# Patient Record
Sex: Male | Born: 1945 | Race: White | Hispanic: No | Marital: Married | Smoking: Former smoker
Health system: Southern US, Community
[De-identification: ages and names within clinical notes are randomized; demographics above are authoritative.]

## PROBLEM LIST (undated history)

## (undated) DIAGNOSIS — I6523 Occlusion and stenosis of bilateral carotid arteries: Secondary | ICD-10-CM

## (undated) DIAGNOSIS — K219 Gastro-esophageal reflux disease without esophagitis: Secondary | ICD-10-CM

## (undated) DIAGNOSIS — E785 Hyperlipidemia, unspecified: Secondary | ICD-10-CM

## (undated) DIAGNOSIS — I480 Paroxysmal atrial fibrillation: Secondary | ICD-10-CM

## (undated) DIAGNOSIS — I4892 Unspecified atrial flutter: Secondary | ICD-10-CM

## (undated) DIAGNOSIS — I499 Cardiac arrhythmia, unspecified: Secondary | ICD-10-CM

## (undated) DIAGNOSIS — E7800 Pure hypercholesterolemia, unspecified: Secondary | ICD-10-CM

## (undated) DIAGNOSIS — I10 Essential (primary) hypertension: Secondary | ICD-10-CM

## (undated) HISTORY — DX: Unspecified atrial flutter: I48.92

## (undated) HISTORY — DX: Essential (primary) hypertension: I10

## (undated) HISTORY — DX: Pure hypercholesterolemia, unspecified: E78.00

## (undated) HISTORY — DX: Occlusion and stenosis of bilateral carotid arteries: I65.23

## (undated) HISTORY — PX: EYE SURGERY: SHX253

## (undated) HISTORY — DX: Paroxysmal atrial fibrillation: I48.0

## (undated) HISTORY — DX: Gastro-esophageal reflux disease without esophagitis: K21.9

## (undated) HISTORY — DX: Hyperlipidemia, unspecified: E78.5

---
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
## 2001-12-29 ENCOUNTER — Emergency Department (HOSPITAL_COMMUNITY): Admission: EM | Admit: 2001-12-29 | Discharge: 2001-12-29 | Payer: Self-pay | Admitting: Emergency Medicine

## 2008-03-09 ENCOUNTER — Emergency Department (HOSPITAL_COMMUNITY): Admission: EM | Admit: 2008-03-09 | Discharge: 2008-03-09 | Payer: Self-pay | Admitting: Emergency Medicine

## 2008-03-09 ENCOUNTER — Ambulatory Visit (HOSPITAL_COMMUNITY): Admission: RE | Admit: 2008-03-09 | Discharge: 2008-03-09 | Payer: Self-pay | Admitting: *Deleted

## 2008-03-09 ENCOUNTER — Encounter (INDEPENDENT_AMBULATORY_CARE_PROVIDER_SITE_OTHER): Payer: Self-pay | Admitting: *Deleted

## 2010-11-19 NOTE — Op Note (Signed)
NAME:  ADRIC, WREDE NO.:  [id number]   MEDICAL RECORD NO.:  [id number]          PATIENT TYPE:  AMB   LOCATION:  ENDO                         FACILITY:  [facility]   PHYSICIAN:  Georgiana Spinner, M.D.    DATE OF BIRTH:  [birth year]   DATE OF PROCEDURE:  03/09/2008  DATE OF DISCHARGE:  03/09/2008                               OPERATIVE REPORT   PROCEDURE:  Colonoscopy.   ANESTHESIA:  Fentanyl 50 mcg, Versed 3 mg.   INDICATIONS:  Colon cancer screening.   PROCEDURE IN DETAIL:  With the patient mildly sedated in the left  lateral decubitus position a rectal exam was performed which was  unremarkable to my exam.  Subsequently the Pentax videoscopic  colonoscope was inserted in the rectum and passed under direct vision to  the cecum, identified by appendiceal orifice and ileocecal valve, both  of which were photographed.  From this point the colonoscope was slowly  withdrawn taking circumferential views of colonic mucosa stopping only  in the ascending colon where a small polyp was seen and it was  photographed and it was removed using hot biopsy forceps technique,  actually we biopsied it with the hot biopsy forceps but did not apply  current to it at that time but then went and eradicated the polyp  remainder with the hot biopsy forceps.  From this point the colonoscope  was then further withdrawn taking circumferential views of colonic  mucosa stopping only in the rectum which appeared normal on direct and  showed internal hemorrhoids on retroflexed view.  The endoscope was  straightened and withdrawn.  The patient's vital signs, pulse oximeter  remained stable.  The patient tolerated the procedure well without  apparent complications.   FINDINGS:  Small polyp of ascending colon.  Internal hemorrhoids.  Otherwise unremarkable exam.   PLAN:  Await biopsy report.  The patient will call me for results and  follow up with me as needed as an outpatient.     ______________________________  Georgiana Spinner, M.D.     GMO/MEDQ  D:  03/09/2008  T:  03/09/2008  Job:  [id number]

## 2010-11-19 NOTE — Op Note (Signed)
NAME:  Douglas Griffith, Douglas Griffith NO.:  [id number]   MEDICAL RECORD NO.:  [id number]          PATIENT TYPE:  EMS   LOCATION:  ED                           FACILITY:  [facility]   PHYSICIAN:  Georgiana Spinner, M.D.    DATE OF BIRTH:  01/18/1946   DATE OF PROCEDURE:  DATE OF DISCHARGE:  03/09/2008                               OPERATIVE REPORT   PROCEDURE:  Upper endoscopy.   INDICATIONS:  GERD.   ANESTHESIA:  Fentanyl 50 mcg, Versed 7 mg.   DESCRIPTION OF PROCEDURE:  With the patient mildly sedated in the left  lateral decubitus position the Pentax videoscopic endoscope was inserted  in the mouth, passed under direct vision through the esophagus, which  appeared normal.  There was no evidence of Barrett's esophagus.  We  entered into the stomach.  The fundus, body, antrum, duodenal bulb, and  second portion of the duodenum appeared normal.  From this point the  endoscope was slowly withdrawn, taking circumferential views of the  duodenal mucosa until the endoscope been pulled back into the stomach  and placed in retroflexion to view the stomach from below.  The  endoscope was straightened and withdrawn, taking circumferential views  of the remaining gastric and esophageal mucosa.  The patient's vital  signs, pulse oximeter remained stable.  The patient tolerated the  procedure well without apparent complications.   FINDINGS:  Negative exam.   PLAN:  Proceed to colonoscopy.           ______________________________  Georgiana Spinner, M.D.     GMO/MEDQ  D:  03/09/2008  T:  03/09/2008  Job:  [id number]

## 2011-04-09 LAB — BASIC METABOLIC PANEL
CO2: 27
Calcium: 9.4
Creatinine, Ser: 0.94
Glucose, Bld: 117 — ABNORMAL HIGH

## 2011-04-09 LAB — POCT I-STAT, CHEM 8
Calcium, Ion: 1.21
Chloride: 108
Creatinine, Ser: 1
Glucose, Bld: 116 — ABNORMAL HIGH
HCT: 47

## 2011-06-06 ENCOUNTER — Encounter: Payer: Self-pay | Admitting: Gastroenterology

## 2011-08-28 ENCOUNTER — Encounter: Payer: Self-pay | Admitting: Internal Medicine

## 2011-10-04 ENCOUNTER — Encounter: Payer: Self-pay | Admitting: Internal Medicine

## 2012-08-05 ENCOUNTER — Ambulatory Visit (INDEPENDENT_AMBULATORY_CARE_PROVIDER_SITE_OTHER): Payer: 59 | Admitting: Internal Medicine

## 2012-08-05 ENCOUNTER — Encounter: Payer: Self-pay | Admitting: Internal Medicine

## 2012-08-05 VITALS — BP 132/84 | HR 61 | Ht 72.0 in | Wt 201.1 lb

## 2012-08-05 DIAGNOSIS — I4891 Unspecified atrial fibrillation: Secondary | ICD-10-CM

## 2012-08-05 NOTE — Progress Notes (Signed)
Primary Care Physician: Pearson Grippe, MD Referring Physician:  Dr Festus Aloe is a 67 y.o. male with a h/o paroxysmal atrial fibrillation who presents today for EP consultation.  He reports that he was initially diagnosed with atrial fibrillation 2-3 years ago after presenting for a colonoscopy and being found to have atrial fibrillation.  He was asymptomatic at the time.  He had a monitor placed which revealed episode afib with RVR.   He was placed on cardizem and flecainide.  He had a repeat cardiac monitor placed 12/13 which revealed significant decrease in afib burden and adequate rate control.  He has rare nocturnal palpitations.  His exercise tolerance and energy are preserved.   Today, he denies symptoms of chest pain, shortness of breath, orthopnea, PND, lower extremity edema, dizziness, presyncope, syncope, or neurologic sequela. The patient is tolerating medications without difficulties and is otherwise without complaint today.   Past Medical History  Diagnosis Date  . Paroxysmal atrial fibrillation   . Atrial flutter   . Hyperlipidemia   . GERD (gastroesophageal reflux disease)    Past Surgical History  Procedure Date  . Appendectomy 1960    Current Outpatient Prescriptions  Medication Sig Dispense Refill  . aspirin 81 MG tablet Take 81 mg by mouth daily.      Marland Kitchen diltiazem (CARDIZEM CD) 180 MG 24 hr capsule Take 180 mg by mouth daily.      Marland Kitchen ezetimibe-simvastatin (VYTORIN) 10-40 MG per tablet Take 1 tablet by mouth at bedtime.      . famotidine (PEPCID AC) 10 MG chewable tablet Chew 10 mg by mouth daily.      . flecainide (TAMBOCOR) 100 MG tablet Take 100 mg by mouth 2 (two) times daily.      . shark liver oil-cocoa butter (PREPARATION H) 0.25-3-85.5 % suppository Place 1 suppository rectally as needed.        No Known Allergies  History   Social History  . Marital Status: Married    Spouse Name: N/A    Number of Children: N/A  . Years of Education: N/A    Occupational History  . Not on file.   Social History Main Topics  . Smoking status: Former Games developer  . Smokeless tobacco: Not on file  . Alcohol Use: Yes     Comment: 2-3 glasses of wine per day  . Drug Use: No  . Sexually Active: Not on file   Other Topics Concern  . Not on file   Social History Narrative   Lives in [place].  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    Family History  Problem Relation Age of Onset  . CAD Father     died of MI at age 35  . CAD Brother     ROS- All systems are reviewed and negative except as per the HPI above  Physical Exam: Filed Vitals:   08/05/12 1225  BP: 156/77  Pulse: 61  Height: 6' (1.829 m)  Weight: 201 lb 1.6 oz (91.218 kg)    GEN- The patient is well appearing, alert and oriented x 3 today.   Head- normocephalic, atraumatic Eyes-  Sclera clear, conjunctiva pink Ears- hearing intact Oropharynx- clear Neck- supple, no JVP Lymph- no cervical lymphadenopathy Lungs- Clear to ausculation bilaterally, normal work of breathing Heart- Regular rate and rhythm, no murmurs, rubs or gallops, PMI not laterally displaced GI- soft, NT, ND, + BS Extremities- no  clubbing, cyanosis, or edema MS- no significant deformity or atrophy Skin- no rash or lesion Psych- euthymic mood, full affect Neuro- strength and sensation are intact  EKG from 07/23/12 reveals sinus rhythm 59 bpm, nonspecific St/ T changes Event monitor is also reviewed  Assessment and Plan:

## 2012-08-05 NOTE — Patient Instructions (Addendum)
Your physician recommends that you schedule a follow-up appointment as needed  

## 2012-08-16 ENCOUNTER — Encounter: Payer: Self-pay | Admitting: Internal Medicine

## 2012-08-16 DIAGNOSIS — I480 Paroxysmal atrial fibrillation: Secondary | ICD-10-CM | POA: Insufficient documentation

## 2012-08-16 NOTE — Assessment & Plan Note (Signed)
The patient has symptomatic paroxysmal atrial fibrillation.  Therapeutic strategies for afib including medicine and ablation were discussed in detail with the patient today. Risk, benefits, and alternatives to EP study and radiofrequency ablation for afib were also discussed in detail today.  At this time, he feels that his afib is reasonably well controlled with flecainide and diltiazem.  He would like to continue this regimen going forward.  He is not interested in ablation at this time.  His CHADS2 score is 0.  He will continue ASA. I will therefore return his care to Dr Jacinto Halim and am happy to see him going forward should he have additional afib and decide to reconsider ablation.

## 2012-08-19 ENCOUNTER — Institutional Professional Consult (permissible substitution): Payer: Self-pay | Admitting: Internal Medicine

## 2015-12-13 DIAGNOSIS — I6523 Occlusion and stenosis of bilateral carotid arteries: Secondary | ICD-10-CM | POA: Diagnosis not present

## 2015-12-13 DIAGNOSIS — E7800 Pure hypercholesterolemia, unspecified: Secondary | ICD-10-CM | POA: Diagnosis not present

## 2015-12-13 DIAGNOSIS — R739 Hyperglycemia, unspecified: Secondary | ICD-10-CM | POA: Diagnosis not present

## 2015-12-20 DIAGNOSIS — E559 Vitamin D deficiency, unspecified: Secondary | ICD-10-CM | POA: Diagnosis not present

## 2015-12-20 DIAGNOSIS — E7800 Pure hypercholesterolemia, unspecified: Secondary | ICD-10-CM | POA: Diagnosis not present

## 2015-12-20 DIAGNOSIS — R739 Hyperglycemia, unspecified: Secondary | ICD-10-CM | POA: Diagnosis not present

## 2015-12-20 DIAGNOSIS — I480 Paroxysmal atrial fibrillation: Secondary | ICD-10-CM | POA: Diagnosis not present

## 2015-12-20 DIAGNOSIS — Z0000 Encounter for general adult medical examination without abnormal findings: Secondary | ICD-10-CM | POA: Diagnosis not present

## 2016-06-18 DIAGNOSIS — I480 Paroxysmal atrial fibrillation: Secondary | ICD-10-CM | POA: Diagnosis not present

## 2016-06-18 DIAGNOSIS — Z125 Encounter for screening for malignant neoplasm of prostate: Secondary | ICD-10-CM | POA: Diagnosis not present

## 2016-06-18 DIAGNOSIS — R739 Hyperglycemia, unspecified: Secondary | ICD-10-CM | POA: Diagnosis not present

## 2016-06-18 DIAGNOSIS — E559 Vitamin D deficiency, unspecified: Secondary | ICD-10-CM | POA: Diagnosis not present

## 2016-06-18 DIAGNOSIS — Z0000 Encounter for general adult medical examination without abnormal findings: Secondary | ICD-10-CM | POA: Diagnosis not present

## 2016-06-18 DIAGNOSIS — E7800 Pure hypercholesterolemia, unspecified: Secondary | ICD-10-CM | POA: Diagnosis not present

## 2016-06-24 DIAGNOSIS — I6523 Occlusion and stenosis of bilateral carotid arteries: Secondary | ICD-10-CM | POA: Diagnosis not present

## 2016-06-26 DIAGNOSIS — I10 Essential (primary) hypertension: Secondary | ICD-10-CM | POA: Diagnosis not present

## 2016-06-26 DIAGNOSIS — I6523 Occlusion and stenosis of bilateral carotid arteries: Secondary | ICD-10-CM | POA: Diagnosis not present

## 2016-06-26 DIAGNOSIS — I480 Paroxysmal atrial fibrillation: Secondary | ICD-10-CM | POA: Diagnosis not present

## 2016-06-26 DIAGNOSIS — E7800 Pure hypercholesterolemia, unspecified: Secondary | ICD-10-CM | POA: Diagnosis not present

## 2016-08-14 DIAGNOSIS — Z125 Encounter for screening for malignant neoplasm of prostate: Secondary | ICD-10-CM | POA: Diagnosis not present

## 2016-08-14 DIAGNOSIS — E559 Vitamin D deficiency, unspecified: Secondary | ICD-10-CM | POA: Diagnosis not present

## 2016-08-21 DIAGNOSIS — E559 Vitamin D deficiency, unspecified: Secondary | ICD-10-CM | POA: Diagnosis not present

## 2016-08-21 DIAGNOSIS — R739 Hyperglycemia, unspecified: Secondary | ICD-10-CM | POA: Diagnosis not present

## 2016-08-21 DIAGNOSIS — Z23 Encounter for immunization: Secondary | ICD-10-CM | POA: Diagnosis not present

## 2016-08-21 DIAGNOSIS — I480 Paroxysmal atrial fibrillation: Secondary | ICD-10-CM | POA: Diagnosis not present

## 2016-08-21 DIAGNOSIS — Z0000 Encounter for general adult medical examination without abnormal findings: Secondary | ICD-10-CM | POA: Diagnosis not present

## 2016-11-25 DIAGNOSIS — H33193 Other retinoschisis and retinal cysts, bilateral: Secondary | ICD-10-CM | POA: Diagnosis not present

## 2016-12-16 DIAGNOSIS — I6523 Occlusion and stenosis of bilateral carotid arteries: Secondary | ICD-10-CM | POA: Diagnosis not present

## 2016-12-16 DIAGNOSIS — R0989 Other specified symptoms and signs involving the circulatory and respiratory systems: Secondary | ICD-10-CM | POA: Diagnosis not present

## 2016-12-22 DIAGNOSIS — H33199 Other retinoschisis and retinal cysts, unspecified eye: Secondary | ICD-10-CM | POA: Diagnosis not present

## 2016-12-22 DIAGNOSIS — H5213 Myopia, bilateral: Secondary | ICD-10-CM | POA: Diagnosis not present

## 2016-12-22 DIAGNOSIS — H2513 Age-related nuclear cataract, bilateral: Secondary | ICD-10-CM | POA: Diagnosis not present

## 2017-01-01 DIAGNOSIS — H52202 Unspecified astigmatism, left eye: Secondary | ICD-10-CM | POA: Diagnosis not present

## 2017-01-01 DIAGNOSIS — K219 Gastro-esophageal reflux disease without esophagitis: Secondary | ICD-10-CM | POA: Diagnosis not present

## 2017-01-01 DIAGNOSIS — Z7901 Long term (current) use of anticoagulants: Secondary | ICD-10-CM | POA: Diagnosis not present

## 2017-01-01 DIAGNOSIS — H2512 Age-related nuclear cataract, left eye: Secondary | ICD-10-CM | POA: Diagnosis not present

## 2017-01-01 DIAGNOSIS — I4891 Unspecified atrial fibrillation: Secondary | ICD-10-CM | POA: Diagnosis not present

## 2017-01-02 DIAGNOSIS — H2511 Age-related nuclear cataract, right eye: Secondary | ICD-10-CM | POA: Diagnosis not present

## 2017-01-08 DIAGNOSIS — H16143 Punctate keratitis, bilateral: Secondary | ICD-10-CM | POA: Diagnosis not present

## 2017-01-13 DIAGNOSIS — H52201 Unspecified astigmatism, right eye: Secondary | ICD-10-CM | POA: Diagnosis not present

## 2017-01-13 DIAGNOSIS — Z7901 Long term (current) use of anticoagulants: Secondary | ICD-10-CM | POA: Diagnosis not present

## 2017-01-13 DIAGNOSIS — K219 Gastro-esophageal reflux disease without esophagitis: Secondary | ICD-10-CM | POA: Diagnosis not present

## 2017-01-13 DIAGNOSIS — I4891 Unspecified atrial fibrillation: Secondary | ICD-10-CM | POA: Diagnosis not present

## 2017-01-13 DIAGNOSIS — H2511 Age-related nuclear cataract, right eye: Secondary | ICD-10-CM | POA: Diagnosis not present

## 2017-01-20 DIAGNOSIS — H16143 Punctate keratitis, bilateral: Secondary | ICD-10-CM | POA: Diagnosis not present

## 2017-02-12 DIAGNOSIS — I480 Paroxysmal atrial fibrillation: Secondary | ICD-10-CM | POA: Diagnosis not present

## 2017-02-12 DIAGNOSIS — E7800 Pure hypercholesterolemia, unspecified: Secondary | ICD-10-CM | POA: Diagnosis not present

## 2017-02-12 DIAGNOSIS — R739 Hyperglycemia, unspecified: Secondary | ICD-10-CM | POA: Diagnosis not present

## 2017-02-12 DIAGNOSIS — D649 Anemia, unspecified: Secondary | ICD-10-CM | POA: Diagnosis not present

## 2017-02-12 DIAGNOSIS — E559 Vitamin D deficiency, unspecified: Secondary | ICD-10-CM | POA: Diagnosis not present

## 2017-02-24 DIAGNOSIS — H16223 Keratoconjunctivitis sicca, not specified as Sjogren's, bilateral: Secondary | ICD-10-CM | POA: Diagnosis not present

## 2017-03-02 DIAGNOSIS — E559 Vitamin D deficiency, unspecified: Secondary | ICD-10-CM | POA: Diagnosis not present

## 2017-03-02 DIAGNOSIS — I480 Paroxysmal atrial fibrillation: Secondary | ICD-10-CM | POA: Diagnosis not present

## 2017-03-02 DIAGNOSIS — E7800 Pure hypercholesterolemia, unspecified: Secondary | ICD-10-CM | POA: Diagnosis not present

## 2017-03-02 DIAGNOSIS — R739 Hyperglycemia, unspecified: Secondary | ICD-10-CM | POA: Diagnosis not present

## 2017-04-09 DIAGNOSIS — H16223 Keratoconjunctivitis sicca, not specified as Sjogren's, bilateral: Secondary | ICD-10-CM | POA: Diagnosis not present

## 2017-06-11 DIAGNOSIS — I6523 Occlusion and stenosis of bilateral carotid arteries: Secondary | ICD-10-CM | POA: Diagnosis not present

## 2017-07-01 DIAGNOSIS — I6523 Occlusion and stenosis of bilateral carotid arteries: Secondary | ICD-10-CM | POA: Diagnosis not present

## 2017-07-01 DIAGNOSIS — E7800 Pure hypercholesterolemia, unspecified: Secondary | ICD-10-CM | POA: Diagnosis not present

## 2017-07-01 DIAGNOSIS — I10 Essential (primary) hypertension: Secondary | ICD-10-CM | POA: Diagnosis not present

## 2017-07-01 DIAGNOSIS — I480 Paroxysmal atrial fibrillation: Secondary | ICD-10-CM | POA: Diagnosis not present

## 2017-08-17 DIAGNOSIS — Z0000 Encounter for general adult medical examination without abnormal findings: Secondary | ICD-10-CM | POA: Diagnosis not present

## 2017-08-17 DIAGNOSIS — E559 Vitamin D deficiency, unspecified: Secondary | ICD-10-CM | POA: Diagnosis not present

## 2017-08-17 DIAGNOSIS — E7800 Pure hypercholesterolemia, unspecified: Secondary | ICD-10-CM | POA: Diagnosis not present

## 2017-08-17 DIAGNOSIS — R739 Hyperglycemia, unspecified: Secondary | ICD-10-CM | POA: Diagnosis not present

## 2017-10-28 DIAGNOSIS — E559 Vitamin D deficiency, unspecified: Secondary | ICD-10-CM | POA: Diagnosis not present

## 2017-10-28 DIAGNOSIS — I480 Paroxysmal atrial fibrillation: Secondary | ICD-10-CM | POA: Diagnosis not present

## 2017-10-28 DIAGNOSIS — E7800 Pure hypercholesterolemia, unspecified: Secondary | ICD-10-CM | POA: Diagnosis not present

## 2017-10-28 DIAGNOSIS — Z0000 Encounter for general adult medical examination without abnormal findings: Secondary | ICD-10-CM | POA: Diagnosis not present

## 2017-12-09 DIAGNOSIS — I6523 Occlusion and stenosis of bilateral carotid arteries: Secondary | ICD-10-CM | POA: Diagnosis not present

## 2018-04-27 DIAGNOSIS — M67911 Unspecified disorder of synovium and tendon, right shoulder: Secondary | ICD-10-CM | POA: Diagnosis not present

## 2018-04-27 DIAGNOSIS — M67912 Unspecified disorder of synovium and tendon, left shoulder: Secondary | ICD-10-CM | POA: Diagnosis not present

## 2018-05-17 DIAGNOSIS — M67912 Unspecified disorder of synovium and tendon, left shoulder: Secondary | ICD-10-CM | POA: Diagnosis not present

## 2018-05-17 DIAGNOSIS — I480 Paroxysmal atrial fibrillation: Secondary | ICD-10-CM | POA: Diagnosis not present

## 2018-05-17 DIAGNOSIS — M7501 Adhesive capsulitis of right shoulder: Secondary | ICD-10-CM | POA: Diagnosis not present

## 2018-05-17 DIAGNOSIS — E7800 Pure hypercholesterolemia, unspecified: Secondary | ICD-10-CM | POA: Diagnosis not present

## 2018-05-17 DIAGNOSIS — M67911 Unspecified disorder of synovium and tendon, right shoulder: Secondary | ICD-10-CM | POA: Diagnosis not present

## 2018-05-17 DIAGNOSIS — E559 Vitamin D deficiency, unspecified: Secondary | ICD-10-CM | POA: Diagnosis not present

## 2018-05-17 DIAGNOSIS — M7502 Adhesive capsulitis of left shoulder: Secondary | ICD-10-CM | POA: Diagnosis not present

## 2018-05-17 DIAGNOSIS — R739 Hyperglycemia, unspecified: Secondary | ICD-10-CM | POA: Diagnosis not present

## 2018-05-20 DIAGNOSIS — M67912 Unspecified disorder of synovium and tendon, left shoulder: Secondary | ICD-10-CM | POA: Diagnosis not present

## 2018-05-20 DIAGNOSIS — R739 Hyperglycemia, unspecified: Secondary | ICD-10-CM | POA: Diagnosis not present

## 2018-05-20 DIAGNOSIS — E559 Vitamin D deficiency, unspecified: Secondary | ICD-10-CM | POA: Diagnosis not present

## 2018-05-20 DIAGNOSIS — M67911 Unspecified disorder of synovium and tendon, right shoulder: Secondary | ICD-10-CM | POA: Diagnosis not present

## 2018-05-20 DIAGNOSIS — E7800 Pure hypercholesterolemia, unspecified: Secondary | ICD-10-CM | POA: Diagnosis not present

## 2018-05-20 DIAGNOSIS — M25611 Stiffness of right shoulder, not elsewhere classified: Secondary | ICD-10-CM | POA: Diagnosis not present

## 2018-05-20 DIAGNOSIS — I480 Paroxysmal atrial fibrillation: Secondary | ICD-10-CM | POA: Diagnosis not present

## 2018-05-28 DIAGNOSIS — M25612 Stiffness of left shoulder, not elsewhere classified: Secondary | ICD-10-CM | POA: Diagnosis not present

## 2018-05-28 DIAGNOSIS — M67911 Unspecified disorder of synovium and tendon, right shoulder: Secondary | ICD-10-CM | POA: Diagnosis not present

## 2018-05-28 DIAGNOSIS — M67912 Unspecified disorder of synovium and tendon, left shoulder: Secondary | ICD-10-CM | POA: Diagnosis not present

## 2018-05-28 DIAGNOSIS — M25611 Stiffness of right shoulder, not elsewhere classified: Secondary | ICD-10-CM | POA: Diagnosis not present

## 2018-05-31 DIAGNOSIS — M67912 Unspecified disorder of synovium and tendon, left shoulder: Secondary | ICD-10-CM | POA: Diagnosis not present

## 2018-05-31 DIAGNOSIS — M25611 Stiffness of right shoulder, not elsewhere classified: Secondary | ICD-10-CM | POA: Diagnosis not present

## 2018-05-31 DIAGNOSIS — M25612 Stiffness of left shoulder, not elsewhere classified: Secondary | ICD-10-CM | POA: Diagnosis not present

## 2018-05-31 DIAGNOSIS — M67911 Unspecified disorder of synovium and tendon, right shoulder: Secondary | ICD-10-CM | POA: Diagnosis not present

## 2018-06-15 DIAGNOSIS — I6523 Occlusion and stenosis of bilateral carotid arteries: Secondary | ICD-10-CM | POA: Diagnosis not present

## 2018-06-28 DIAGNOSIS — E7800 Pure hypercholesterolemia, unspecified: Secondary | ICD-10-CM | POA: Diagnosis not present

## 2018-06-28 DIAGNOSIS — I480 Paroxysmal atrial fibrillation: Secondary | ICD-10-CM | POA: Diagnosis not present

## 2018-06-28 DIAGNOSIS — I6522 Occlusion and stenosis of left carotid artery: Secondary | ICD-10-CM | POA: Diagnosis not present

## 2018-06-28 DIAGNOSIS — I10 Essential (primary) hypertension: Secondary | ICD-10-CM | POA: Diagnosis not present

## 2018-08-13 ENCOUNTER — Other Ambulatory Visit: Payer: Self-pay | Admitting: Cardiology

## 2018-09-10 ENCOUNTER — Other Ambulatory Visit: Payer: Self-pay

## 2018-09-10 MED ORDER — RIVAROXABAN 20 MG PO TABS: 20.0000 mg | ORAL_TABLET | Freq: Every day | ORAL | 1 refills | Status: DC

## 2018-09-24 ENCOUNTER — Other Ambulatory Visit: Payer: Self-pay | Admitting: Cardiology

## 2018-09-24 DIAGNOSIS — I10 Essential (primary) hypertension: Secondary | ICD-10-CM

## 2018-10-19 ENCOUNTER — Other Ambulatory Visit: Payer: Self-pay | Admitting: Cardiology

## 2018-10-19 DIAGNOSIS — I6522 Occlusion and stenosis of left carotid artery: Secondary | ICD-10-CM

## 2018-10-29 ENCOUNTER — Other Ambulatory Visit: Payer: Self-pay | Admitting: Cardiology

## 2018-11-01 ENCOUNTER — Other Ambulatory Visit: Payer: Self-pay | Admitting: Cardiology

## 2018-11-01 MED ORDER — DILT-XR 180 MG PO CP24: 180.0000 mg | ORAL_CAPSULE | Freq: Two times a day (BID) | ORAL | 3 refills | Status: DC

## 2018-11-15 DIAGNOSIS — E559 Vitamin D deficiency, unspecified: Secondary | ICD-10-CM | POA: Diagnosis not present

## 2018-11-15 DIAGNOSIS — I10 Essential (primary) hypertension: Secondary | ICD-10-CM | POA: Diagnosis not present

## 2018-11-15 DIAGNOSIS — E7800 Pure hypercholesterolemia, unspecified: Secondary | ICD-10-CM | POA: Diagnosis not present

## 2018-11-15 DIAGNOSIS — Z0000 Encounter for general adult medical examination without abnormal findings: Secondary | ICD-10-CM | POA: Diagnosis not present

## 2018-11-15 DIAGNOSIS — R739 Hyperglycemia, unspecified: Secondary | ICD-10-CM | POA: Diagnosis not present

## 2018-11-18 DIAGNOSIS — E559 Vitamin D deficiency, unspecified: Secondary | ICD-10-CM | POA: Diagnosis not present

## 2018-11-18 DIAGNOSIS — R739 Hyperglycemia, unspecified: Secondary | ICD-10-CM | POA: Diagnosis not present

## 2018-11-18 DIAGNOSIS — I10 Essential (primary) hypertension: Secondary | ICD-10-CM | POA: Diagnosis not present

## 2018-11-18 DIAGNOSIS — E7800 Pure hypercholesterolemia, unspecified: Secondary | ICD-10-CM | POA: Diagnosis not present

## 2018-12-15 ENCOUNTER — Other Ambulatory Visit: Payer: Self-pay

## 2018-12-18 ENCOUNTER — Other Ambulatory Visit: Payer: Self-pay | Admitting: Cardiology

## 2018-12-18 DIAGNOSIS — I10 Essential (primary) hypertension: Secondary | ICD-10-CM

## 2018-12-23 ENCOUNTER — Ambulatory Visit: Payer: Self-pay | Admitting: Cardiology

## 2019-01-06 ENCOUNTER — Other Ambulatory Visit: Payer: Self-pay

## 2019-01-06 ENCOUNTER — Ambulatory Visit: Payer: Medicare Other

## 2019-01-06 DIAGNOSIS — I6522 Occlusion and stenosis of left carotid artery: Secondary | ICD-10-CM

## 2019-01-13 ENCOUNTER — Encounter: Payer: Self-pay | Admitting: Cardiology

## 2019-01-13 ENCOUNTER — Other Ambulatory Visit: Payer: Self-pay

## 2019-01-13 ENCOUNTER — Ambulatory Visit: Payer: Medicare Other | Admitting: Cardiology

## 2019-01-13 VITALS — BP 140/78 | Ht 72.0 in | Wt 194.0 lb

## 2019-01-13 DIAGNOSIS — I480 Paroxysmal atrial fibrillation: Secondary | ICD-10-CM | POA: Diagnosis not present

## 2019-01-13 DIAGNOSIS — I6523 Occlusion and stenosis of bilateral carotid arteries: Secondary | ICD-10-CM | POA: Insufficient documentation

## 2019-01-13 DIAGNOSIS — I10 Essential (primary) hypertension: Secondary | ICD-10-CM | POA: Diagnosis not present

## 2019-01-13 DIAGNOSIS — E7800 Pure hypercholesterolemia, unspecified: Secondary | ICD-10-CM

## 2019-01-13 NOTE — Progress Notes (Signed)
Virtual Visit via Video Note: This visit type was conducted due to national recommendations for restrictions regarding the COVID-19 Pandemic (e.g. social distancing).  This format is felt to be most appropriate for this patient at this time.  All issues noted in this document were discussed and addressed.  No physical exam was performed (except for noted visual exam findings with Telehealth visits).  The patient has consented to conduct a Telehealth visit and understands insurance will be billed.   I connected with@, on 01/13/19 at  by a video enabled telemedicine application and verified that I am speaking with the correct person using two identifiers.   I discussed the limitations of evaluation and management by telemedicine and the availability of in person appointments. The patient expressed understanding and agreed to proceed.   I have discussed with patient regarding the safety during COVID Pandemic and steps and precautions to be taken including social distancing, frequent hand wash and use of detergent soap, gels with the patient. I asked the patient to avoid touching mouth, nose, eyes, ears with the hands. I encouraged regular walking around the neighborhood and exercise and regular diet, as long as social distancing can be maintained.  Primary Physician/Referring:  Jani Gravel, MD  Patient ID: Douglas Griffith, male    DOB: [birth year], 73 y.o.   MRN: [id number]  Chief Complaint  Patient presents with  . Atrial Fibrillation  . Follow-up    HPI: Douglas Griffith  is a 73 y.o. male   Caucasian male with history of hypertension, hyperlipidemia, asymptomatic carotid stenosis and  paroxysmal atrial fibrillation. He presents today for one year follow-up of atrial fibrillation and also carotid stenosis.  Patient has not had any further palpitations in the past 6 months. No chest pain, no dyspnea, no dizziness or syncope. No symptoms to suggest TIA or claudication.  Past Medical History:   Diagnosis Date  . Asymptomatic bilateral carotid artery stenosis   . Atrial flutter ([place])   . Essential hypertension   . GERD (gastroesophageal reflux disease)   . Hypercholesteremia   . Paroxysmal atrial fibrillation [facility])     Past Surgical History:  Procedure Laterality Date  . APPENDECTOMY  1960    Social History   Socioeconomic History  . Marital status: Married    Spouse name: Not on file  . Number of children: 2  . Years of education: Not on file  . Highest education level: Not on file  Occupational History  . Not on file  Social Needs  . Financial resource strain: Not on file  . Food insecurity    Worry: Not on file    Inability: Not on file  . Transportation needs    Medical: Not on file    Non-medical: Not on file  Tobacco Use  . Smoking status: Former Research scientist (life sciences)  . Smokeless tobacco: Never Used  . Tobacco comment: smoked as a teen  Substance and Sexual Activity  . Alcohol use: Yes    Comment: 2-3 glasses of wine per day  . Drug use: No  . Sexual activity: Not on file  Lifestyle  . Physical activity    Days per week: Not on file    Minutes per session: Not on file  . Stress: Not on file  Relationships  . Social Herbalist on phone: Not on file    Gets together: Not on file    Attends religious service: Not on file    Active member of club or  organization: Not on file    Attends meetings of clubs or organizations: Not on file    Relationship status: Not on file  . Intimate partner violence    Fear of current or ex partner: Not on file    Emotionally abused: Not on file    Physically abused: Not on file    Forced sexual activity: Not on file  Other Topics Concern  . Not on file  Social History Narrative   Lives in [place].  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    Review of Systems  Constitution: Negative for chills, decreased appetite, malaise/fatigue and weight  gain.  Cardiovascular: Negative for dyspnea on exertion, leg swelling and syncope.  Endocrine: Negative for cold intolerance.  Hematologic/Lymphatic: Does not bruise/bleed easily.  Musculoskeletal: Negative for joint swelling.  Gastrointestinal: Negative for abdominal pain, anorexia, change in bowel habit, hematochezia and melena.  Neurological: Positive for dizziness (occasional). Negative for headaches and light-headedness.  Psychiatric/Behavioral: Negative for depression and substance abuse.  All other systems reviewed and are negative.  Objective  Blood pressure 140/78, height 6' (1.829 m), weight 194 lb (88 kg). Body mass index is 26.31 kg/m. Physical exam not performed or limited due to virtual visit.  Patient appeared to be in no distress, Neck was supple, respiration was not labored.  Please see exam details from prior visit is as below.    Physical Exam  Constitutional: He appears well-developed and well-nourished. No distress.  HENT:  Head: Atraumatic.  Eyes: Conjunctivae are normal.  Neck: Neck supple. No JVD present. No thyromegaly present.  Cardiovascular: Normal rate, regular rhythm, normal heart sounds, intact distal pulses and normal pulses. Exam reveals no gallop.  No murmur heard. No edema. No JVD. Vascular exam except for right carotid bruit is normal.   Pulmonary/Chest: Effort normal and breath sounds normal.  Abdominal: Soft. Bowel sounds are normal.  Musculoskeletal: Normal range of motion.  Neurological: He is alert.  Skin: Skin is warm and dry.  Psychiatric: He has a normal mood and affect.   Radiology: No results found.  Laboratory examination:    05/17/2018: WBC 4.3, CBC otherwise normal.  Creatinine 0.87, EGFR 85, potassium 4.4, CMP normal.  Cholesterol 170, triglycerides 60, HDL 70, LDL 88.  Hemoglobin A1c 5.3%.  Vitamin D low at 28.9.  TSH normal.  CMP 03/09/2008 03/09/2008  Glucose 116(H) 117(H)  BUN 12 11  Creatinine 1.0 0.94  Sodium 141 140   Potassium 3.7 3.7  Chloride 108 110  CO2 - 27  Calcium - 9.4   CBC 03/09/2008  Hemoglobin 16.0  Hematocrit 47.0   Lipid Panel  No results found for: CHOL, TRIG, HDL, CHOLHDL, VLDL, LDLCALC, LDLDIRECT HEMOGLOBIN A1C No results found for: HGBA1C, MPG TSH No results for input(s): TSH in the last 8760 hours.   Medications   Current Outpatient Medications  Medication Instructions  . Dilt-XR 180 mg, Oral, 2 times daily  . ezetimibe-simvastatin (VYTORIN) 10-40 MG per tablet 1 tablet, Daily at bedtime  . famotidine (PEPCID AC) 10 mg, Daily  . flecainide (TAMBOCOR) 100 mg, 2 times daily  . losartan (COZAAR) 25 MG tablet TAKE 2 TABLETS BY MOUTH EVERY DAY  . rivaroxaban (XARELTO) 20 mg, Oral, Daily with supper  . shark liver oil-cocoa butter (PREPARATION H) 0.25-3-85.5 % suppository 1 suppository, As needed    Cardiac Studies:   Echo 06/12/11: Normal LVEF. Mild LVH. Trace AI.  Exercise sestamibi 2012:  Exercise duration 12 min, 15 METs. Diaphragmatic attenuation. No ischemia. EF 64%. low risk.  Event Monitor 06/16/12 through 07/15/2012: Paroxysmal episodes of A. Fibrillation and A. Flutter, asymptomatic. Mostly rate controlled.  Carotid artery duplex 01/06/2019  Stenosis in the right internal carotid artery (16-49%). Stenosis in the left internal carotid artery (50-69%). The left PSV internal/common carotid artery ratio of 4.14 is consistent with a stenosis of >70%. Antegrade right vertebral artery flow. Antegrade left vertebral artery flow. Compared to 06/15/2018, minimal increase in the left ICA stenosis. Right ICA stenosis new. Follow up in six months is appropriate if clinically indicated.  Assessment  1. Paroxysmal atrial fibrillation (HCC)  2. Asymptomatic bilateral carotid artery stenosis  - PCV CAROTID DUPLEX (BILATERAL); Future  3. Essential hypertension  4. Hypercholesteremia  - Obtain medical records  Recommendations:   Patient is presently doing well and  essentially remains asymptomatic.  I reviewed the results of the recently performed carotid artery duplex, he has mild progression of left carotid stenosis.  Will continue to do 6 monthly surveillance for now.  Blood pressure is well controlled, he has been exercising on a regular basis, I do not have his recent labs but patient states that his lipids are very well controlled.  I'll try to obtain the results of the recently performed labs from my evaluation.  I'd like to see him back in 6 months for follow-up with repeat Carotid duplex.  Adrian Prows, MD, [facility] 01/13/2019, 8:47 AM [facility]. [place] Pager: [phone] Office: [phone] If no answer Cell [phone]

## 2019-01-14 ENCOUNTER — Other Ambulatory Visit: Payer: Self-pay | Admitting: Cardiology

## 2019-01-14 DIAGNOSIS — I6523 Occlusion and stenosis of bilateral carotid arteries: Secondary | ICD-10-CM

## 2019-01-16 ENCOUNTER — Other Ambulatory Visit: Payer: Self-pay | Admitting: Cardiology

## 2019-01-16 DIAGNOSIS — I6523 Occlusion and stenosis of bilateral carotid arteries: Secondary | ICD-10-CM

## 2019-01-16 NOTE — Progress Notes (Unsigned)
Orders updated

## 2019-03-21 ENCOUNTER — Other Ambulatory Visit: Payer: Self-pay | Admitting: Cardiology

## 2019-03-21 DIAGNOSIS — I10 Essential (primary) hypertension: Secondary | ICD-10-CM

## 2019-03-23 ENCOUNTER — Other Ambulatory Visit: Payer: Self-pay

## 2019-03-23 NOTE — Telephone Encounter (Signed)
Can I fill his flecainide

## 2019-03-24 MED ORDER — FLECAINIDE ACETATE 100 MG PO TABS: 100.0000 mg | ORAL_TABLET | Freq: Two times a day (BID) | ORAL | 3 refills | Status: DC

## 2019-05-12 DIAGNOSIS — Z0000 Encounter for general adult medical examination without abnormal findings: Secondary | ICD-10-CM | POA: Diagnosis not present

## 2019-05-12 DIAGNOSIS — I10 Essential (primary) hypertension: Secondary | ICD-10-CM | POA: Diagnosis not present

## 2019-05-12 DIAGNOSIS — D509 Iron deficiency anemia, unspecified: Secondary | ICD-10-CM | POA: Diagnosis not present

## 2019-05-12 DIAGNOSIS — E785 Hyperlipidemia, unspecified: Secondary | ICD-10-CM | POA: Diagnosis not present

## 2019-05-12 DIAGNOSIS — R739 Hyperglycemia, unspecified: Secondary | ICD-10-CM | POA: Diagnosis not present

## 2019-05-19 DIAGNOSIS — I6529 Occlusion and stenosis of unspecified carotid artery: Secondary | ICD-10-CM | POA: Diagnosis not present

## 2019-05-19 DIAGNOSIS — E785 Hyperlipidemia, unspecified: Secondary | ICD-10-CM | POA: Diagnosis not present

## 2019-05-19 DIAGNOSIS — I10 Essential (primary) hypertension: Secondary | ICD-10-CM | POA: Diagnosis not present

## 2019-05-19 DIAGNOSIS — D649 Anemia, unspecified: Secondary | ICD-10-CM | POA: Diagnosis not present

## 2019-05-20 ENCOUNTER — Other Ambulatory Visit: Payer: Self-pay | Admitting: Cardiology

## 2019-06-23 ENCOUNTER — Ambulatory Visit (INDEPENDENT_AMBULATORY_CARE_PROVIDER_SITE_OTHER): Payer: Medicare Other

## 2019-06-23 ENCOUNTER — Other Ambulatory Visit: Payer: Self-pay | Admitting: Cardiology

## 2019-06-23 ENCOUNTER — Other Ambulatory Visit: Payer: Self-pay

## 2019-06-23 DIAGNOSIS — I6523 Occlusion and stenosis of bilateral carotid arteries: Secondary | ICD-10-CM

## 2019-06-23 DIAGNOSIS — I10 Essential (primary) hypertension: Secondary | ICD-10-CM

## 2019-06-27 ENCOUNTER — Other Ambulatory Visit: Payer: Medicare Other

## 2019-06-27 ENCOUNTER — Other Ambulatory Visit: Payer: Self-pay | Admitting: Cardiology

## 2019-06-27 DIAGNOSIS — I6523 Occlusion and stenosis of bilateral carotid arteries: Secondary | ICD-10-CM

## 2019-06-27 NOTE — Telephone Encounter (Signed)
I have placed CT neck orders and patient is aware. Let me know if I have to do anything. Need to do before his visit or change his date of visit

## 2019-06-27 NOTE — Telephone Encounter (Signed)
Please read

## 2019-07-04 ENCOUNTER — Telehealth: Payer: Self-pay | Admitting: Cardiology

## 2019-07-04 ENCOUNTER — Ambulatory Visit: Payer: Medicare Other | Admitting: Cardiology

## 2019-07-04 NOTE — Telephone Encounter (Signed)
Authrization # 07/04/19 December 30, 2019#[id number]

## 2019-07-04 NOTE — Telephone Encounter (Signed)
That # does                                                   go through and you get them online and I can talk to them now

## 2019-07-06 DIAGNOSIS — H43393 Other vitreous opacities, bilateral: Secondary | ICD-10-CM | POA: Diagnosis not present

## 2019-07-12 ENCOUNTER — Other Ambulatory Visit: Payer: Self-pay | Admitting: Cardiology

## 2019-07-12 ENCOUNTER — Ambulatory Visit
Admission: RE | Admit: 2019-07-12 | Discharge: 2019-07-12 | Disposition: A | Discharge status: Home / Self Care | Payer: Medicare Other | Source: Ambulatory Visit | Attending: Cardiology | Admitting: Cardiology

## 2019-07-12 ENCOUNTER — Other Ambulatory Visit: Payer: Self-pay

## 2019-07-12 DIAGNOSIS — I6502 Occlusion and stenosis of left vertebral artery: Secondary | ICD-10-CM | POA: Diagnosis not present

## 2019-07-12 DIAGNOSIS — I6523 Occlusion and stenosis of bilateral carotid arteries: Secondary | ICD-10-CM

## 2019-07-12 MED ORDER — IOPAMIDOL (ISOVUE-370) INJECTION 76%
75.0000 mL | Freq: Once | INTRAVENOUS | Status: AC | PRN
  Administered 2019-07-12: 75 mL via INTRAVENOUS

## 2019-07-18 ENCOUNTER — Encounter: Payer: Self-pay | Admitting: Surgery

## 2019-07-18 ENCOUNTER — Other Ambulatory Visit: Payer: Self-pay

## 2019-07-18 ENCOUNTER — Ambulatory Visit (INDEPENDENT_AMBULATORY_CARE_PROVIDER_SITE_OTHER): Payer: Medicare Other | Admitting: Surgery

## 2019-07-18 VITALS — BP 143/73 | HR 62 | Temp 98.2°F | Resp 20 | Ht 72.0 in | Wt 188.3 lb

## 2019-07-18 DIAGNOSIS — I6523 Occlusion and stenosis of bilateral carotid arteries: Secondary | ICD-10-CM | POA: Diagnosis not present

## 2019-07-18 MED ORDER — CLOPIDOGREL BISULFATE 75 MG PO TABS: 75.0000 mg | ORAL_TABLET | Freq: Every day | ORAL | 6 refills | Status: DC

## 2019-07-18 NOTE — H&P (View-Only) (Signed)
Vascular and Vein Specialist of [facility]  Patient name: Douglas Griffith MRN: [id number] DOB: [birth year] Sex: male   REQUESTING PROVIDER:    Dr. Jacinto Halim   REASON FOR CONSULT:    Carotid disease  HISTORY OF PRESENT ILLNESS:   Douglas Griffith is a 74 y.o. male, who is referred for evaluation of left carotid stenosis.  The patient's carotid disease was initially detected by hearing a bruit on physical exam.  He has been followed with serial ultrasounds.  Most recently his ultrasound progressed to greater than 80% and he went for a CT angiogram which showed a 90% left carotid lesion.  With regards to his symptomatology.  The patient states that for the past 3 months he has had 3 or 4 episodes where his vision got cloudy.  This lasted for about 10 to 15 seconds and resolved.  He has not had any other upper or lower extremity symptoms or slurred speech.  The patient is a former smoker.  He does have a family history of premature cardiovascular disease with his dad having heart issues in his 13s.  He is on Xarelto for atrial fibrillation.  He is medically managed for hypertension.  He takes a statin for hypercholesterolemia.  PAST MEDICAL HISTORY    Past Medical History:  Diagnosis Date  . Asymptomatic bilateral carotid artery stenosis   . Atrial flutter (HCC)   . Essential hypertension   . GERD (gastroesophageal reflux disease)   . Hypercholesteremia   . Paroxysmal atrial fibrillation (HCC)      FAMILY HISTORY   Family History  Problem Relation Age of Onset  . CAD Father        died of MI at age 71  . CAD Brother     SOCIAL HISTORY:   Social History   Socioeconomic History  . Marital status: Married    Spouse name: Not on file  . Number of children: 2  . Years of education: Not on file  . Highest education level: Not on file  Occupational History  . Not on file  Tobacco Use  . Smoking status: Former Games developer  . Smokeless tobacco:  Never Used  . Tobacco comment: smoked as a teen  Substance and Sexual Activity  . Alcohol use: Yes    Comment: 2-3 glasses of wine per day  . Drug use: No  . Sexual activity: Not on file  Other Topics Concern  . Not on file  Social History Narrative   Lives in [place].  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   Social Determinants of Health   Financial Resource Strain:   . Difficulty of Paying Living Expenses: Not on file  Food Insecurity:   . Worried About Programme researcher, broadcasting/film/video in the Last Year: Not on file  . Ran Out of Food in the Last Year: Not on file  Transportation Needs:   . Lack of Transportation (Medical): Not on file  . Lack of Transportation (Non-Medical): Not on file  Physical Activity:   . Days of Exercise per Week: Not on file  . Minutes of Exercise per Session: Not on file  Stress:   . Feeling of Stress : Not on file  Social Connections:   . Frequency of Communication with Friends and Family: Not on file  . Frequency of Social Gatherings with Friends and Family: Not on file  . Attends Religious Services: Not on file  .  Active Member of Clubs or Organizations: Not on file  . Attends Banker Meetings: Not on file  . Marital Status: Not on file  Intimate Partner Violence:   . Fear of Current or Ex-Partner: Not on file  . Emotionally Abused: Not on file  . Physically Abused: Not on file  . Sexually Abused: Not on file    ALLERGIES:    No Known Allergies  CURRENT MEDICATIONS:    Current Outpatient Medications  Medication Sig Dispense Refill  . DILT-XR 180 MG 24 hr capsule Take 1 capsule (180 mg total) by mouth 2 (two) times a day. 180 capsule 3  . ezetimibe-simvastatin (VYTORIN) 10-40 MG per tablet Take 1 tablet by mouth at bedtime.    . famotidine (PEPCID AC) 10 MG chewable tablet Chew 10 mg by mouth daily.    . flecainide (TAMBOCOR) 100 MG tablet Take 1 tablet (100 mg  total) by mouth 2 (two) times daily. 180 tablet 3  . losartan (COZAAR) 25 MG tablet TAKE 2 TABLETS BY MOUTH EVERY DAY 180 tablet 0  . XARELTO 20 MG TABS tablet TAKE 1 TABLET (20 MG TOTAL) BY MOUTH DAILY WITH SUPPER. 90 tablet 1   No current facility-administered medications for this visit.    REVIEW OF SYSTEMS:   [X]  denotes positive finding, [ ]  denotes negative finding Cardiac  Comments:  Chest pain or chest pressure:    Shortness of breath upon exertion:    Short of breath when lying flat:    Irregular heart rhythm:        Vascular    Pain in calf, thigh, or hip brought on by ambulation:    Pain in feet at night that wakes you up from your sleep:     Blood clot in your veins:    Leg swelling:         Pulmonary    Oxygen at home:    Productive cough:     Wheezing:         Neurologic    Sudden weakness in arms or legs:     Sudden numbness in arms or legs:     Sudden onset of difficulty speaking or slurred speech:    Temporary loss of vision in one eye:     Problems with dizziness:         Gastrointestinal    Blood in stool:      Vomited blood:         Genitourinary    Burning when urinating:     Blood in urine:        Psychiatric    Major depression:         Hematologic    Bleeding problems:    Problems with blood clotting too easily:        Skin    Rashes or ulcers:        Constitutional    Fever or chills:     PHYSICAL EXAM:   Vitals:   07/18/19 1419 07/18/19 1421  BP: (!) 152/74 (!) 143/73  Pulse: 62   Resp: 20   Temp: 98.2 F (36.8 C)   SpO2: 98%   Weight: 188 lb 4.8 oz (85.4 kg)   Height: 6' (1.829 m)     GENERAL: The patient is a well-nourished male, in no acute distress. The vital signs are documented above. CARDIAC: There is a regular rate and rhythm.  VASCULAR: Palpable pedal pulses PULMONARY: Nonlabored respirations ABDOMEN: Soft and non-tender  MUSCULOSKELETAL: There are no major deformities or cyanosis. NEUROLOGIC: No focal  weakness or paresthesias are detected. SKIN: There are no ulcers or rashes noted. PSYCHIATRIC: The patient has a normal affect.  STUDIES:   I have reviewed his CTA neck with the following findings  1. Critical, flow-limiting stenosis proximal left internal carotid artery due to atherosclerotic disease. Estimated 90% diameter stenosis proximal left internal carotid artery 2. Atherosclerotic disease right carotid bifurcation with less than 25% diameter stenosis right internal carotid artery. 3. Moderate stenosis origin of left vertebral artery. Right vertebral artery widely patent without stenosis.  Duplex: Carotid artery duplex  06/23/2019: Stenosis in the right internal carotid artery (16-49%). Critical stenosis in the left internal carotid artery (near occlusion). Reduced Doppler flow signal without velocity elevation.  Stenosis severity may be an error due to heterogeneous plaque and calcification. Stenosis in the left external carotid artery (<50%). Antegrade right vertebral artery flow. Antegrade left vertebral artery flow. Compared to the study done on 01/06/2019, there is progression of left ICA stenosis. Follow up in four months is appropriate if clinically indicated. Consider angiography (CT vs invasive).   ASSESSMENT and PLAN   Amaurosis fugax, symptomatic left carotid stenosis: The patient has had a CT angiogram showing greater than 90% left carotid stenosis which is consistent with his ultrasound.  He has had several episodes of visual disturbances consistent with amaurosis.  We discussed proceeding with treatment of his stenosis.  I discussed carotid endarterectomy as well as stenting.  I had favored stenting because of the distal extent of his lesion being high.  I discussed the details of the operation including the risk of stroke and bleeding.  He would like to discuss this with Dr. Einar Gip and then schedule this as soon as possible.  I did discuss that he would need to be on  aspirin and Plavix in addition to his Xarelto for 1 month after his procedure.  He will have Dr. Einar Gip reach out to me after he sees him tomorrow.  He will continue taking his statin.   Leia Alf, MD, FACS [facility] and Vein Specialists of [facility] [phone] Pager [phone]

## 2019-07-18 NOTE — Progress Notes (Signed)
 Vascular and Vein Specialist of [place]  Patient name: Douglas Griffith MRN: [id number] DOB: 08/19/1945 Sex: male   REQUESTING PROVIDER:    Dr. Ganji   REASON FOR CONSULT:    Carotid disease  HISTORY OF PRESENT ILLNESS:   Fisher C Douglas Griffith is a 74 y.o. male, who is referred for evaluation of left carotid stenosis.  The patient's carotid disease was initially detected by hearing a bruit on physical exam.  He has been followed with serial ultrasounds.  Most recently his ultrasound progressed to greater than 80% and he went for a CT angiogram which showed a 90% left carotid lesion.  With regards to his symptomatology.  The patient states that for the past 3 months he has had 3 or 4 episodes where his vision got cloudy.  This lasted for about 10 to 15 seconds and resolved.  He has not had any other upper or lower extremity symptoms or slurred speech.  The patient is a former smoker.  He does have a family history of premature cardiovascular disease with his dad having heart issues in his 40s.  He is on Xarelto for atrial fibrillation.  He is medically managed for hypertension.  He takes a statin for hypercholesterolemia.  PAST MEDICAL HISTORY    Past Medical History:  Diagnosis Date  . Asymptomatic bilateral carotid artery stenosis   . Atrial flutter (HCC)   . Essential hypertension   . GERD (gastroesophageal reflux disease)   . Hypercholesteremia   . Paroxysmal atrial fibrillation (HCC)      FAMILY HISTORY   Family History  Problem Relation Age of Onset  . CAD Father        died of MI at age 44  . CAD Brother     SOCIAL HISTORY:   Social History   Socioeconomic History  . Marital status: Married    Spouse name: Not on file  . Number of children: 2  . Years of education: Not on file  . Highest education level: Not on file  Occupational History  . Not on file  Tobacco Use  . Smoking status: Former Smoker  . Smokeless tobacco:  Never Used  . Tobacco comment: smoked as a teen  Substance and Sexual Activity  . Alcohol use: Yes    Comment: 2-3 glasses of wine per day  . Drug use: No  . Sexual activity: Not on file  Other Topics Concern  . Not on file  Social History Narrative   Lives in [place].  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   Social Determinants of Health   Financial Resource Strain:   . Difficulty of Paying Living Expenses: Not on file  Food Insecurity:   . Worried About Running Out of Food in the Last Year: Not on file  . Ran Out of Food in the Last Year: Not on file  Transportation Needs:   . Lack of Transportation (Medical): Not on file  . Lack of Transportation (Non-Medical): Not on file  Physical Activity:   . Days of Exercise per Week: Not on file  . Minutes of Exercise per Session: Not on file  Stress:   . Feeling of Stress : Not on file  Social Connections:   . Frequency of Communication with Friends and Family: Not on file  . Frequency of Social Gatherings with Friends and Family: Not on file  . Attends Religious Services: Not on file  .   Active Member of Clubs or Organizations: Not on file  . Attends Banker Meetings: Not on file  . Marital Status: Not on file  Intimate Partner Violence:   . Fear of Current or Ex-Partner: Not on file  . Emotionally Abused: Not on file  . Physically Abused: Not on file  . Sexually Abused: Not on file    ALLERGIES:    No Known Allergies  CURRENT MEDICATIONS:    Current Outpatient Medications  Medication Sig Dispense Refill  . DILT-XR 180 MG 24 hr capsule Take 1 capsule (180 mg total) by mouth 2 (two) times a day. 180 capsule 3  . ezetimibe-simvastatin (VYTORIN) 10-40 MG per tablet Take 1 tablet by mouth at bedtime.    . famotidine (PEPCID AC) 10 MG chewable tablet Chew 10 mg by mouth daily.    . flecainide (TAMBOCOR) 100 MG tablet Take 1 tablet (100 mg  total) by mouth 2 (two) times daily. 180 tablet 3  . losartan (COZAAR) 25 MG tablet TAKE 2 TABLETS BY MOUTH EVERY DAY 180 tablet 0  . XARELTO 20 MG TABS tablet TAKE 1 TABLET (20 MG TOTAL) BY MOUTH DAILY WITH SUPPER. 90 tablet 1   No current facility-administered medications for this visit.    REVIEW OF SYSTEMS:   [X]  denotes positive finding, [ ]  denotes negative finding Cardiac  Comments:  Chest pain or chest pressure:    Shortness of breath upon exertion:    Short of breath when lying flat:    Irregular heart rhythm:        Vascular    Pain in calf, thigh, or hip brought on by ambulation:    Pain in feet at night that wakes you up from your sleep:     Blood clot in your veins:    Leg swelling:         Pulmonary    Oxygen at home:    Productive cough:     Wheezing:         Neurologic    Sudden weakness in arms or legs:     Sudden numbness in arms or legs:     Sudden onset of difficulty speaking or slurred speech:    Temporary loss of vision in one eye:     Problems with dizziness:         Gastrointestinal    Blood in stool:      Vomited blood:         Genitourinary    Burning when urinating:     Blood in urine:        Psychiatric    Major depression:         Hematologic    Bleeding problems:    Problems with blood clotting too easily:        Skin    Rashes or ulcers:        Constitutional    Fever or chills:     PHYSICAL EXAM:   Vitals:   07/18/19 1419 07/18/19 1421  BP: (!) 152/74 (!) 143/73  Pulse: 62   Resp: 20   Temp: 98.2 F (36.8 C)   SpO2: 98%   Weight: 188 lb 4.8 oz (85.4 kg)   Height: 6' (1.829 m)     GENERAL: The patient is a well-nourished male, in no acute distress. The vital signs are documented above. CARDIAC: There is a regular rate and rhythm.  VASCULAR: Palpable pedal pulses PULMONARY: Nonlabored respirations ABDOMEN: Soft and non-tender  MUSCULOSKELETAL: There are no major deformities or cyanosis. NEUROLOGIC: No focal  weakness or paresthesias are detected. SKIN: There are no ulcers or rashes noted. PSYCHIATRIC: The patient has a normal affect.  STUDIES:   I have reviewed his CTA neck with the following findings  1. Critical, flow-limiting stenosis proximal left internal carotid artery due to atherosclerotic disease. Estimated 90% diameter stenosis proximal left internal carotid artery 2. Atherosclerotic disease right carotid bifurcation with less than 25% diameter stenosis right internal carotid artery. 3. Moderate stenosis origin of left vertebral artery. Right vertebral artery widely patent without stenosis.  Duplex: Carotid artery duplex  06/23/2019: Stenosis in the right internal carotid artery (16-49%). Critical stenosis in the left internal carotid artery (near occlusion). Reduced Doppler flow signal without velocity elevation.  Stenosis severity may be an error due to heterogeneous plaque and calcification. Stenosis in the left external carotid artery (<50%). Antegrade right vertebral artery flow. Antegrade left vertebral artery flow. Compared to the study done on 01/06/2019, there is progression of left ICA stenosis. Follow up in four months is appropriate if clinically indicated. Consider angiography (CT vs invasive).   ASSESSMENT and PLAN   Amaurosis fugax, symptomatic left carotid stenosis: The patient has had a CT angiogram showing greater than 90% left carotid stenosis which is consistent with his ultrasound.  He has had several episodes of visual disturbances consistent with amaurosis.  We discussed proceeding with treatment of his stenosis.  I discussed carotid endarterectomy as well as stenting.  I had favored stenting because of the distal extent of his lesion being high.  I discussed the details of the operation including the risk of stroke and bleeding.  He would like to discuss this with Dr. Einar Gip and then schedule this as soon as possible.  I did discuss that he would need to be on  aspirin and Plavix in addition to his Xarelto for 1 month after his procedure.  He will have Dr. Einar Gip reach out to me after he sees him tomorrow.  He will continue taking his statin.   Leia Alf, MD, FACS [facility] Vein Specialists of [facility] [phone] Pager [phone]

## 2019-07-19 ENCOUNTER — Ambulatory Visit: Payer: Medicare Other | Admitting: Cardiology

## 2019-07-19 ENCOUNTER — Encounter: Payer: Self-pay | Admitting: Cardiology

## 2019-07-19 ENCOUNTER — Other Ambulatory Visit: Payer: Self-pay

## 2019-07-19 VITALS — BP 160/69 | HR 61 | Temp 98.1°F | Ht 72.0 in | Wt 201.9 lb

## 2019-07-19 DIAGNOSIS — I6522 Occlusion and stenosis of left carotid artery: Secondary | ICD-10-CM

## 2019-07-19 DIAGNOSIS — I10 Essential (primary) hypertension: Secondary | ICD-10-CM | POA: Diagnosis not present

## 2019-07-19 DIAGNOSIS — I6523 Occlusion and stenosis of bilateral carotid arteries: Secondary | ICD-10-CM

## 2019-07-19 DIAGNOSIS — E7800 Pure hypercholesterolemia, unspecified: Secondary | ICD-10-CM

## 2019-07-19 DIAGNOSIS — I480 Paroxysmal atrial fibrillation: Secondary | ICD-10-CM

## 2019-07-19 NOTE — Progress Notes (Signed)
Primary Physician/Referring:  Jani Gravel, MD  Patient ID: Douglas Griffith, male    DOB: 04/01/1946, 74 y.o.   MRN: [id number]  Chief Complaint  Patient presents with  . Atrial Fibrillation  . carotid artery stenosis  . Follow-up    91moDISCUSS MEDS    HPI: Douglas Griffith is a 74y.o. male   Caucasian male with history of hypertension, hyperlipidemia, asymptomatic carotid stenosis and  paroxysmal atrial fibrillation, recent carotid [place]vealing progression of carotid stenosis and underwnet CTA which confirmed high grade left carotid stenosis and now being evaluated for revascularization by Dr. WAnnamarie Major   He has been having transient visual blurring left eye lately.   Patient has not had any further palpitations in the past 6 months. No chest pain, no dyspnea, no dizziness or syncope. No symptoms to suggest TIA or claudication.  Past Medical History:  Diagnosis Date  . Asymptomatic bilateral carotid artery stenosis   . [place]   . Essential hypertension   . GERD (gastroesophageal reflux disease)   . Hypercholesteremia   . Paroxysmal atrial fibrillation ([facility]     Past Surgical History:  Procedure Laterality Date  . APPENDECTOMY  1960    Social History   Socioeconomic History  . Marital status: Married    Spouse name: Not on file  . Number of children: 3  . Years of education: Not on file  . Highest education level: Not on file  Occupational History  . Not on file  Tobacco Use  . Smoking status: Former Smoker    Types: Cigarettes    Quit date: 07/19/1959    Years since quitting: 60.0  . Smokeless tobacco: Never Used  . Tobacco comment: smoked as a teen  Substance and Sexual Activity  . Alcohol use: Yes    Comment: 1-1.5 glasses of wine per day  . Drug use: No  . Sexual activity: Not on file  Other Topics Concern  . Not on file  Social History Narrative   [place] 40 hours per week  (semi retired)   Married with grown children.   Social Determinants of Health   Financial Resource Strain:   . Difficulty of Paying Living Expenses: Not on file  Food Insecurity:   . Worried About RCharity fundraiserin the Last Year: Not on file  . Ran Out of Food in the Last Year: Not on file  Transportation Needs:   . Lack of Transportation (Medical): Not on file  . Lack of Transportation (Non-Medical): Not on file  Physical Activity:   . Days of Exercise per Week: Not on file  . Minutes of Exercise per Session: Not on file  Stress:   . Feeling of Stress : Not on file  Social Connections:   . Frequency of Communication with Friends and Family: Not on file  . Frequency of Social Gatherings with Friends and Family: Not on file  . Attends Religious Services: Not on file  . Active Member of Clubs or Organizations: Not on file  . Attends CArchivistMeetings: Not on file  . Marital Status: Not on file  Intimate Partner Violence:   . Fear of Current or Ex-Partner: Not on file  . Emotionally Abused: Not on file  . Physically Abused: Not on file  . Sexually Abused: Not on file   Review of Systems  Constitution: Negative for chills, decreased appetite, malaise/fatigue and weight  gain.  Cardiovascular: Negative for dyspnea on exertion, leg swelling and syncope.  Endocrine: Negative for cold intolerance.  Hematologic/Lymphatic: Does not bruise/bleed easily.  Musculoskeletal: Negative for joint swelling.  Gastrointestinal: Negative for abdominal pain, anorexia, change in bowel habit, hematochezia and melena.  Neurological: Positive for dizziness (occasional). Negative for headaches and light-headedness.  Psychiatric/Behavioral: Negative for depression and substance abuse.  All other systems reviewed and are negative.  Objective  Blood pressure (!) 160/69, pulse 61, temperature 98.1 F (36.7 C), height 6' (1.829 m), weight 201 lb 14.4 oz (91.6 kg), SpO2 95 %. Body mass  index is 27.38 kg/m.  Physical Exam  Constitutional: He appears well-developed and well-nourished. No distress.  HENT:  Head: Atraumatic.  Eyes: Conjunctivae are normal.  Neck: No JVD present. No thyromegaly present.  Cardiovascular: Normal rate, regular rhythm, normal heart sounds, intact distal pulses and normal pulses. Exam reveals no gallop.  No murmur heard. No edema. No JVD. Vascular exam except for right carotid bruit is normal.   Pulmonary/Chest: Effort normal and breath sounds normal.  Abdominal: Soft. Bowel sounds are normal.  Musculoskeletal:        General: Normal range of motion.     Cervical back: Neck supple.  Neurological: He is alert.  Skin: Skin is warm and dry.  Psychiatric: He has a normal mood and affect.    Laboratory examination:   CMP 03/09/2008 03/09/2008  Glucose 116(H) 117(H)  BUN 12 11  Creatinine 1.0 0.94  Sodium 141 140  Potassium 3.7 3.7  Chloride 108 110  CO2 - 27  Calcium - 9.4   CBC 03/09/2008  Hemoglobin 16.0  Hematocrit 47.0   Lipid Panel  No results found for: CHOL, TRIG, HDL, CHOLHDL, VLDL, LDLCALC, LDLDIRECT HEMOGLOBIN A1C No results found for: HGBA1C, MPG TSH No results for input(s): TSH in the last 8760 hours.  05/17/2018: WBC 4.3, CBC otherwise normal.  Creatinine 0.87, EGFR 85, potassium 4.4, CMP normal.  Cholesterol 170, triglycerides 60, HDL 70, LDL 88.  Hemoglobin A1c 5.3%.  Vitamin D low at 28.9.  TSH normal.  Medications   Current Outpatient Medications  Medication Instructions  . clopidogrel (PLAVIX) 75 mg, Oral, Daily  . Dilt-XR 180 mg, Oral, 2 times daily  . ezetimibe-simvastatin (VYTORIN) 10-40 MG per tablet 1 tablet, Daily at bedtime  . famotidine (PEPCID AC) 10 mg, Daily  . flecainide (TAMBOCOR) 100 mg, Oral, 2 times daily  . losartan (COZAAR) 25 MG tablet TAKE 2 TABLETS BY MOUTH EVERY DAY  . XARELTO 20 MG TABS tablet TAKE 1 TABLET (20 MG TOTAL) BY MOUTH DAILY WITH SUPPER.   Radiology: CTA neck 08/01/2019: 1.  Critical, flow-limiting stenosis proximal left internal carotid artery due to atherosclerotic disease. Estimated 90% diameter stenosis proximal left internal carotid artery 2. Atherosclerotic disease right carotid bifurcation with less than 25% diameter stenosis right internal carotid artery. 3. Moderate stenosis origin of left vertebral artery. Right vertebral artery widely patent without stenosis.  Cardiac Studies:   Echo 06/12/11: Normal LVEF. Mild LVH. Trace AI.  Exercise sestamibi 2012: Exercise duration 12 min, 15 METs. Diaphragmatic attenuation. No ischemia. EF 64%. low risk.  Event Monitor 06/16/12 through 07/15/2012: Paroxysmal episodes of A. Fibrillation and A. Flutter, asymptomatic. Mostly rate controlled.  Carotid artery duplex  06/23/2019: Stenosis in the right internal carotid artery (16-49%). Critical stenosis in the left internal carotid artery (near occlusion). Reduced Doppler flow signal without velocity elevation.  Stenosis severity may be an error due to heterogeneous plaque and calcification. Stenosis  in the left external carotid artery (<50%). Antegrade right vertebral artery flow. Antegrade left vertebral artery flow. Compared to the study done on 01/06/2019, there is progression of left ICA stenosis. Follow up in four months is appropriate if clinically indicated. Consider angiography (CT vs invasive).   Assessment     ICD-10-CM   1. Symptomatic carotid artery stenosis, left  I65.22   2. Bilateral carotid artery stenosis  I65.23   3. Paroxysmal atrial fibrillation (HCC) CHA2DS2-VASc Score is 5.  Yearly risk of stroke: 6.7% (A, HTN, Vasc dz, TIA)   I48.0 EKG 12-Lead  4. Essential hypertension  I10   5. Hypercholesteremia  E78.00     EKG 07/19/2019: Sinus bradycardia at rate of 56 bpm, LAE,  normal axis.  Poor R-wave progression, probably normal variant.  No evidence of ischemia.   Recommendations:   ARIES TOWNLEY  is a 74 y.o. male   Caucasian male with  history of hypertension, hyperlipidemia, asymptomatic carotid stenosis and  paroxysmal atrial fibrillation, recent carotid US revealing progression of carotid stenosis and underwnet CTA which confirmed high grade left carotid stenosis and now being evaluated for trans-carotid artery revascularization with flow reversal by Dr. Annamarie Major.   He  Has had episodes of transient visual disturbances when he suddenly gets up from supine position possibly suggestive of amaurosis vs steal due to decreased blood supply with position change.  Blood pressure is slightly elevated, but will allow permissive hypertension until carotid revascularization.  He is aware he will need triple therapy if stenting is done with plavix and ASA and Xarelto for PAF for at least 4 weeks. I will see him back in 6 weeks from now and he wishes to proceed with carotid revascularization. Lipids previously controlled and will get recent labs from PCP.     Adrian Prows, MD, [facility] 07/19/2019, 10:59 PM [facility]  CC: Annamarie Major, MD

## 2019-07-20 ENCOUNTER — Encounter: Payer: Self-pay | Admitting: *Deleted

## 2019-07-20 ENCOUNTER — Telehealth: Payer: Self-pay | Admitting: *Deleted

## 2019-07-20 ENCOUNTER — Ambulatory Visit: Payer: Medicare Other | Attending: Internal Medicine

## 2019-07-20 DIAGNOSIS — Z23 Encounter for immunization: Secondary | ICD-10-CM | POA: Insufficient documentation

## 2019-07-20 NOTE — Telephone Encounter (Signed)
Call to patient that surgery will be changed to 07/27/2019 per Dr. Myra Gianotti. Instructed to take statin, Aspirin, Plavis and Statin daily including am of surgery. Hold Xarelto for 48 hours prior to surgery. Expect a call and follow detailed surgery instructions received from the pre-admission department. Nasal swab at [facility] 07/25/2019 at 8:25 am. Verbalized understanding to call this office if questions.

## 2019-07-20 NOTE — Progress Notes (Signed)
   Covid-19 Vaccination Clinic  Name:  Douglas Griffith    MRN: [id number] DOB: Jul 25, 1945  07/20/2019  [name] was observed post Covid-19 immunization for 30 minutes based on pre-vaccination screening without incidence. He was provided with Vaccine Information Sheet and instruction to access the V-Safe system.   [name] was instructed to call 911 with any severe reactions post vaccine: Marland Kitchen Difficulty breathing  . Swelling of your face and throat  . A fast heartbeat  . A bad rash all over your body  . Dizziness and weakness    Immunizations Administered    Name Date Dose VIS Date Route   Pfizer COVID-19 Vaccine 07/20/2019  9:31 AM 0.3 mL 06/17/2019 Intramuscular   Manufacturer: [employer], [employer]   Lot: [id number]   NDC: [address]-2

## 2019-07-22 NOTE — Progress Notes (Signed)
[id number] [address], [place] [address] Phone: [phone] Fax: [phone]      Your procedure is scheduled on January 20  Report to [facility] Main Entrance "A" at 0800 A.M., and check in at the Admitting office.  Call this number if you have problems the morning of surgery:  [phone]  Call [phone] if you have any questions prior to your surgery date Monday-Friday 8am-4pm    Remember:  Do not eat or drink after midnight the night before your surgery    Take these medicines the morning of surgery with A SIP OF WATER  DILT-XR  flecainide (TAMBOCOR)   Follow your surgeon's instructions on what to do about your Aspirin and Plavix.  If no instructions were given by your surgeon then you will need to call the office to get those instructions.    Your Last Dose of Xarelto should be on 07/24/19  7 days prior to surgery STOP taking any  Aleve, Naproxen, Ibuprofen, Motrin, Advil, Goody's, BC's, all herbal medications, fish oil, and all vitamins.    The Morning of Surgery  Do not wear jewelry  Do not wear lotions, powders, or /colognes, or deodorant  Do not shave 48 hours prior to surgery.  Men may shave face and neck.  Do not bring valuables to the hospital.  [facility] is not responsible for any belongings or valuables.  If you are a smoker, DO NOT Smoke 24 hours prior to surgery  If you wear a CPAP at night please bring your mask, tubing, and machine the morning of surgery   Remember that you must have someone to transport you home after your surgery, and remain with you for 24 hours if you are discharged the same day.   Please bring cases for contacts, glasses, hearing aids, dentures or bridgework because it cannot be worn into surgery.    Leave your suitcase in the car.  After surgery it may be brought to your room.  For patients admitted to the hospital, discharge time will be determined by  your treatment team.  Patients discharged the day of surgery will not be allowed to drive home.    Special instructions:   [place]- Preparing For Surgery  Before surgery, you can play an important role. Because skin is not sterile, your skin needs to be as free of germs as possible. You can reduce the number of germs on your skin by washing with CHG (chlorahexidine gluconate) Soap before surgery.  CHG is an antiseptic cleaner which kills germs and bonds with the skin to continue killing germs even after washing.    Oral Hygiene is also important to reduce your risk of infection.  Remember - BRUSH YOUR TEETH THE MORNING OF SURGERY WITH YOUR REGULAR TOOTHPASTE  Please do not use if you have an allergy to CHG or antibacterial soaps. If your skin becomes reddened/irritated stop using the CHG.  Do not shave (including legs and underarms) for at least 48 hours prior to first CHG shower. It is OK to shave your face.  Please follow these instructions carefully.   1. Shower the NIGHT BEFORE SURGERY and the MORNING OF SURGERY with CHG Soap.   2. If you chose to wash your hair, wash your hair first as usual with your normal shampoo.  3. After you shampoo, rinse your hair and body thoroughly to remove the shampoo.  4. Use CHG as you would any other  liquid soap. You can apply CHG directly to the skin and wash gently with a scrungie or a clean washcloth.   5. Apply the CHG Soap to your body ONLY FROM THE NECK DOWN.  Do not use on open wounds or open sores. Avoid contact with your eyes, ears, mouth and genitals (private parts). Wash Face and genitals (private parts)  with your normal soap.   6. Wash thoroughly, paying special attention to the area where your surgery will be performed.  7. Thoroughly rinse your body with warm water from the neck down.  8. DO NOT shower/wash with your normal soap after using and rinsing off the CHG Soap.  9. Pat yourself dry with a CLEAN TOWEL.  10. Wear CLEAN  PAJAMAS to bed the night before surgery, wear comfortable clothes the morning of surgery  11. Place CLEAN SHEETS on your bed the night of your first shower and DO NOT SLEEP WITH PETS.    Day of Surgery:  Please shower the morning of surgery with the CHG soap Do not apply any deodorants/lotions. Please wear clean clothes to the hospital/surgery center.   Remember to brush your teeth WITH YOUR REGULAR TOOTHPASTE.   Please read over the following fact sheets that you were given.

## 2019-07-25 ENCOUNTER — Other Ambulatory Visit (HOSPITAL_COMMUNITY)
Admission: RE | Admit: 2019-07-25 | Discharge: 2019-07-25 | Disposition: A | Discharge status: Home / Self Care | Payer: Medicare Other | Source: Ambulatory Visit | Attending: Surgery | Admitting: Surgery

## 2019-07-25 ENCOUNTER — Encounter (HOSPITAL_COMMUNITY): Payer: Self-pay | Admitting: Surgery

## 2019-07-25 ENCOUNTER — Encounter (HOSPITAL_COMMUNITY)
Admission: RE | Admit: 2019-07-25 | Discharge: 2019-07-25 | Disposition: A | Discharge status: Home / Self Care | Payer: Medicare Other | Source: Ambulatory Visit | Attending: Surgery | Admitting: Surgery

## 2019-07-25 ENCOUNTER — Encounter (HOSPITAL_COMMUNITY): Payer: Self-pay

## 2019-07-25 ENCOUNTER — Encounter (HOSPITAL_COMMUNITY): Payer: Self-pay | Admitting: Certified Registered Nurse Anesthetist

## 2019-07-25 ENCOUNTER — Encounter (HOSPITAL_COMMUNITY): Payer: Self-pay | Admitting: Physician Assistant

## 2019-07-25 ENCOUNTER — Other Ambulatory Visit: Payer: Self-pay

## 2019-07-25 DIAGNOSIS — U071 COVID-19: Secondary | ICD-10-CM | POA: Insufficient documentation

## 2019-07-25 DIAGNOSIS — R001 Bradycardia, unspecified: Secondary | ICD-10-CM | POA: Insufficient documentation

## 2019-07-25 DIAGNOSIS — Z01812 Encounter for preprocedural laboratory examination: Secondary | ICD-10-CM | POA: Insufficient documentation

## 2019-07-25 DIAGNOSIS — I6523 Occlusion and stenosis of bilateral carotid arteries: Secondary | ICD-10-CM | POA: Insufficient documentation

## 2019-07-25 DIAGNOSIS — I480 Paroxysmal atrial fibrillation: Secondary | ICD-10-CM | POA: Insufficient documentation

## 2019-07-25 LAB — URINALYSIS, ROUTINE W REFLEX MICROSCOPIC
Bacteria, UA: NONE SEEN
Bilirubin Urine: NEGATIVE
Glucose, UA: NEGATIVE mg/dL
Ketones, ur: NEGATIVE mg/dL
Leukocytes,Ua: NEGATIVE
Nitrite: NEGATIVE
Protein, ur: NEGATIVE mg/dL
Specific Gravity, Urine: 1.019 (ref 1.005–1.030)
pH: 5 (ref 5.0–8.0)

## 2019-07-25 LAB — COMPREHENSIVE METABOLIC PANEL
ALT: 28 U/L (ref 0–44)
AST: 28 U/L (ref 15–41)
Albumin: 4.1 g/dL (ref 3.5–5.0)
Alkaline Phosphatase: 54 U/L (ref 38–126)
Anion gap: 10 (ref 5–15)
BUN: 17 mg/dL (ref 8–23)
CO2: 26 mmol/L (ref 22–32)
Calcium: 9.5 mg/dL (ref 8.9–10.3)
Chloride: 103 mmol/L (ref 98–111)
Creatinine, Ser: 0.96 mg/dL (ref 0.61–1.24)
GFR calc Af Amer: >60 mL/min (ref >60)
GFR calc non Af Amer: >60 mL/min (ref >60)
Glucose, Bld: 106 mg/dL — ABNORMAL HIGH (ref 70–99)
Potassium: 4 mmol/L (ref 3.5–5.1)
Sodium: 139 mmol/L (ref 135–145)
Total Bilirubin: 0.7 mg/dL (ref 0.3–1.2)
Total Protein: 7.4 g/dL (ref 6.5–8.1)

## 2019-07-25 LAB — SURGICAL PCR SCREEN
MRSA, PCR: NEGATIVE
Staphylococcus aureus: NEGATIVE

## 2019-07-25 LAB — CBC
HCT: 43.8 % (ref 39.0–52.0)
Hemoglobin: 14.1 g/dL (ref 13.0–17.0)
MCH: 31.2 pg (ref 26.0–34.0)
MCHC: 32.2 g/dL (ref 30.0–36.0)
MCV: 96.9 fL (ref 80.0–100.0)
Platelets: 330 10*3/uL (ref 150–400)
RBC: 4.52 MIL/uL (ref 4.22–5.81)
RDW: 12.6 % (ref 11.5–15.5)
WBC: 6.6 10*3/uL (ref 4.0–10.5)
nRBC: 0 % (ref 0.0–0.2)

## 2019-07-25 LAB — TYPE AND SCREEN
ABO/RH(D): A POS
Antibody Screen: NEGATIVE

## 2019-07-25 LAB — APTT: aPTT: 33 seconds (ref 24–36)

## 2019-07-25 LAB — PROTIME-INR
INR: 1.2 (ref 0.8–1.2)
Prothrombin Time: 15.4 seconds — ABNORMAL HIGH (ref 11.4–15.2)

## 2019-07-25 LAB — SARS CORONAVIRUS 2 (TAT 6-24 HRS): SARS Coronavirus 2: POSITIVE — AB

## 2019-07-25 LAB — ABO/RH: ABO/RH(D): A POS

## 2019-07-25 NOTE — Progress Notes (Signed)
[id number] [place], [place] - [address] [place] [address] Phone: [phone] Fax: [phone]      Your procedure is scheduled on January 20  Report to [facility] Main Entrance "A" at 0800 A.M., and check in at the Admitting office.  Call this number if you have problems the morning of surgery:  [phone]  Call [phone] if you have any questions prior to your surgery date Monday-Friday 8am-4pm    Remember:  Do not eat or drink after midnight the night before your surgery    Take these medicines the morning of surgery with A SIP OF WATER  DILT-XR  flecainide (TAMBOCOR)  Aspirin plavix Ezetimibe-simvastatin (vytorin)   Your Last Dose of Xarelto should be on 07/24/19  7 days prior to surgery STOP taking any  Aleve, Naproxen, Ibuprofen, Motrin, Advil, Goody's, BC's, all herbal medications, fish oil, and all vitamins.    The Morning of Surgery  Do not wear jewelry  Do not wear lotions, powders, or /colognes, or deodorant  Do not shave 48 hours prior to surgery.  Men may shave face and neck.  Do not bring valuables to the hospital.  [facility] is not responsible for any belongings or valuables  Remember that you must have someone to transport you home after your surgery, and remain with you for 24 hours if you are discharged the same day.   Please bring cases for contacts, glasses, hearing aids, dentures or bridgework because it cannot be worn into surgery.    Leave your suitcase in the car.  After surgery it may be brought to your room.  For patients admitted to the hospital, discharge time will be determined by your treatment team.  Patients discharged the day of surgery will not be allowed to drive home.    Special instructions:   [place]- Preparing For Surgery  Before surgery, you can play an important role. Because skin is not sterile, your skin needs to be as free of germs as possible. You can  reduce the number of germs on your skin by washing with CHG (chlorahexidine gluconate) Soap before surgery.  CHG is an antiseptic cleaner which kills germs and bonds with the skin to continue killing germs even after washing.    Oral Hygiene is also important to reduce your risk of infection.  Remember - BRUSH YOUR TEETH THE MORNING OF SURGERY WITH YOUR REGULAR TOOTHPASTE  Please do not use if you have an allergy to CHG or antibacterial soaps. If your skin becomes reddened/irritated stop using the CHG.  Do not shave (including legs and underarms) for at least 48 hours prior to first CHG shower. It is OK to shave your face.  Please follow these instructions carefully.   1. Shower the NIGHT BEFORE SURGERY and the MORNING OF SURGERY with CHG Soap.   2. If you chose to wash your hair, wash your hair first as usual with your normal shampoo.  3. After you shampoo, wash your face and private area with the soap you use at home, then rinse your hair and body thoroughly to remove the shampoo and soap.  4. Use CHG as you would any other liquid soap. You can apply CHG directly to the skin and wash gently with a scrungie or a clean washcloth.   5. Apply the CHG Soap to your body ONLY FROM THE NECK DOWN.  Do not use on open wounds or open sores. Avoid contact with your eyes,  ears, mouth and genitals (private parts).   6. Wash thoroughly, paying special attention to the area where your surgery will be performed.  7. Thoroughly rinse your body with warm water from the neck down.  8. DO NOT shower/wash with your normal soap after using and rinsing off the CHG Soap.  9. Pat yourself dry with a CLEAN TOWEL.  10. Wear CLEAN PAJAMAS to bed the night before surgery, wear comfortable clothes the morning of surgery  11. Place CLEAN SHEETS on your bed the night of your first shower and DO NOT SLEEP WITH PETS Day of Surgery: Shower as instructed above. Do not apply any deodorants/lotions/powders or  colognes. Please wear clean clothes to the hospital/surgery center.   Remember to brush your teeth WITH YOUR REGULAR TOOTHPASTE.  Please read over the following fact sheets that you were given.: Pain Booklet, Patient Instructions for Mupirocin Application,Coughing and Deep Breathing, Pain Booklet, Patient Instructions for Mupirocin Application and Surgical Site Infections, Surgical Site Infections.

## 2019-07-25 NOTE — Anesthesia Preprocedure Evaluation (Deleted)
Anesthesia Evaluation    Airway        Dental   Pulmonary former smoker,           Cardiovascular hypertension,      Neuro/Psych    GI/Hepatic   Endo/Other    Renal/GU      Musculoskeletal   Abdominal   Peds  Hematology   Anesthesia Other Findings   Reproductive/Obstetrics                             Anesthesia Physical Anesthesia Plan  ASA:   Anesthesia Plan:    Post-op Pain Management:    Induction:   PONV Risk Score and Plan:   Airway Management Planned:   Additional Equipment:   Intra-op Plan:   Post-operative Plan:   Informed Consent:   Plan Discussed with:   Anesthesia Plan Comments: (Follows with Dr. Jacinto Halim for history of paroxysmal afib and carotid stenosis. Discussed upcoming TCAR at last OV 07/19/19.   Preop labs reviewed, WNL.  EKG 07/19/19: Sinus bradycardia at rate of 56 bpm, LAE,  normal axis.  Poor R-wave progression, probably normal variant.  No evidence of ischemia.   CTA neck 08/01/2019: 1. Critical, flow-limiting stenosis proximal left internal carotid artery due to atherosclerotic disease. Estimated 90% diameter stenosis proximal left internal carotid artery 2. Atherosclerotic disease right carotid bifurcation with less than 25% diameter stenosis right internal carotid artery. 3. Moderate stenosis origin of left vertebral artery. Right vertebral artery widely patent without stenosis.  Carotid artery duplex 06/23/2019: Stenosis in the right internal carotid artery (16-49%). Critical stenosis in the left internal carotid artery (near occlusion). Reduced Doppler flow signal without velocity elevation. Stenosis severity may be an error due to heterogeneous plaque and calcification. Stenosis in the left external carotid artery (<50%). Antegrade right vertebral artery flow. Antegrade left vertebral artery flow. Compared to the study done on 01/06/2019,  there is progression of left ICA stenosis. Follow up in four months is appropriate if clinically indicated. Consider angiography (CT vs invasive).   Event Monitor 06/16/12 through 07/15/2012: Paroxysmal episodes of A. Fibrillation and A. Flutter, asymptomatic. Mostly rate controlled.  Exercise sestamibi 2012: Exercise duration 12 min, 15 METs. Diaphragmatic attenuation. No ischemia. EF 64%. low risk.  Echo 06/12/11: Normal LVEF. Mild LVH. Trace AI. )        Anesthesia Quick Evaluation

## 2019-07-25 NOTE — Progress Notes (Signed)
Your procedure is scheduled on January 20  Report to [facility] Main Entrance "A" at 0800 A.M., and check in at the Admitting office.  Call this number if you have problems the morning of surgery:  [phone]  Call [phone] if you have any questions prior to your surgery date Monday-Friday 8am-4pm    Remember:  Do not eat or drink after midnight the night before your surgery    Take these medicines the morning of surgery with A SIP OF WATER  DILT-XR  flecainide (TAMBOCOR)  Aspirin plavix Ezetimibe-simvastatin (vytorin)   Your Last Dose of Xarelto should be on 07/24/19  7 days prior to surgery STOP taking any  Aleve, Naproxen, Ibuprofen, Motrin, Advil, Goody's, BC's, all herbal medications, fish oil, and all vitamins.  Special instructions:   [place]- Preparing For Surgery  Before surgery, you can play an important role. Because skin is not sterile, your skin needs to be as free of germs as possible. You can reduce the number of germs on your skin by washing with CHG (chlorahexidine gluconate) Soap before surgery.  CHG is an antiseptic cleaner which kills germs and bonds with the skin to continue killing germs even after washing.    Oral Hygiene is also important to reduce your risk of infection.  Remember - BRUSH YOUR TEETH THE MORNING OF SURGERY WITH YOUR REGULAR TOOTHPASTE  Please do not use if you have an allergy to CHG or antibacterial soaps. If your skin becomes reddened/irritated stop using the CHG.  Do not shave (including legs and underarms) for at least 48 hours prior to first CHG shower. It is OK to shave your face.  Please follow these instructions carefully.   1. Shower the NIGHT BEFORE SURGERY and the MORNING OF SURGERY with CHG Soap.   2. If you chose to wash your hair, wash your hair first as usual with your normal shampoo.  3. After you shampoo, rinse your hair and body thoroughly to remove the shampoo.  4. Use CHG as you would any other liquid  soap. You can apply CHG directly to the skin and wash gently with a scrungie or a clean washcloth.   5. Apply the CHG Soap to your body ONLY FROM THE NECK DOWN.  Do not use on open wounds or open sores. Avoid contact with your eyes, ears, mouth and genitals (private parts). Wash Face and genitals (private parts)  with your normal soap.   6. Wash thoroughly, paying special attention to the area where your surgery will be performed.  7. Thoroughly rinse your body with warm water from the neck down.  8. DO NOT shower/wash with your normal soap after using and rinsing off the CHG Soap.  9. Pat yourself dry with a CLEAN TOWEL.  10. Wear CLEAN PAJAMAS to bed the night before surgery, wear comfortable clothes the morning of surgery  11. Place CLEAN SHEETS on your bed the night of your first shower and DO NOT SLEEP WITH PETS.    Day of Surgery:  Please shower the morning of surgery with the CHG soap Do not apply any deodorants/lotions. Please wear clean clothes to the hospital/surgery center.   Remember to brush your teeth WITH YOUR REGULAR TOOTHPASTE.    Do not wear jewelry  Do not wear lotions, powders, or /colognes, or deodorant  Do not shave 48 hours prior to surgery.              Men may shave face and neck.  Do not bring valuables to the hospital.  [facility] is not responsible for any belongings or valuables.  Remember that you must have someone to transport you home after your surgery, and remain with you for 24 hours if you are discharged the same day.   Please bring cases for contacts, glasses, hearing aids, dentures or bridgework because it cannot be worn into surgery.    Leave your suitcase in the car.  After surgery it may be brought to your room.  For patients admitted to the hospital, discharge time will be determined by your treatment team.  Patients discharged the day of surgery will not be allowed to drive home.    Please read over the following fact sheets that  you were given.

## 2019-07-25 NOTE — Progress Notes (Addendum)
Your procedure is scheduled on January 20  Report to [facility] Main Entrance "A" at 0800 A.M., and check in at the Admitting office.  Call this number if you have problems the morning of surgery:  [phone]  Call [phone] if you have any questions prior to your surgery date Monday-Friday 8am-4pm    Remember:  Do not eat or drink after midnight the night before your surgery    Take these medicines the morning of surgery with A SIP OF WATER  DILT-XR  flecainide (TAMBOCOR)  Aspirin Plavix Ezetimibe-simvastatin (vytorin) Plavix  Your Last Dose of Xarelto should be on 07/24/19  7 days prior to surgery STOP taking any  Aleve, Naproxen, Ibuprofen, Motrin, Advil, Goody's, BC's, all herbal medications, fish oil, and all vitamins.    Special instructions:   [place]- Preparing For Surgery  Before surgery, you can play an important role. Because skin is not sterile, your skin needs to be as free of germs as possible. You can reduce the number of germs on your skin by washing with CHG (chlorahexidine gluconate) Soap before surgery.  CHG is an antiseptic cleaner which kills germs and bonds with the skin to continue killing germs even after washing.    Oral Hygiene is also important to reduce your risk of infection.  Remember - BRUSH YOUR TEETH THE MORNING OF SURGERY WITH YOUR REGULAR TOOTHPASTE  Please do not use if you have an allergy to CHG or antibacterial soaps. If your skin becomes reddened/irritated stop using the CHG.  Do not shave (including legs and underarms) for at least 48 hours prior to first CHG shower. It is OK to shave your face.  Please follow these instructions carefully.   1. Shower the NIGHT BEFORE SURGERY and the MORNING OF SURGERY with CHG Soap.   2. If you chose to wash your hair, wash your hair first as usual with your normal shampoo.  3. After you shampoo,wash your face and private area with the soap you use at home, then rinse your hair and body  thoroughly to remove the shampoo and soap.   4. Use CHG as you would any other liquid soap. You can apply CHG directly to the skin and wash gently with a scrungie or a clean washcloth.   5. Apply the CHG Soap to your body ONLY FROM THE NECK DOWN.  Do not use on open wounds or open sores. Avoid contact with your eyes, ears, mouth and genitals (private parts). Wash Face and genitals (private parts)  with your normal soap.   6. Wash thoroughly, paying special attention to the area where your surgery will be performed.  7. Thoroughly rinse your body with warm water from the neck down.  8. DO NOT shower/wash with your normal soap after using and rinsing off the CHG Soap.  9. Pat yourself dry with a CLEAN TOWEL.  10. Wear CLEAN PAJAMAS to bed the night before surgery, wear comfortable clothes the morning of surgery  11. Place CLEAN SHEETS on your bed the night of your first shower and DO NOT SLEEP WITH PETS.  Day of Surgery: Shower as instructed above. Do not apply any deodorants/lotions/powders or colognes. Please wear clean clothes to the hospital/surgery center.   Remember to brush your teeth WITH YOUR REGULAR TOOTHPASTE.  Please read over the following fact sheets that you were given: Pain Booklet, Patient Instructions for Mupirocin Application, Coughing and Deep Breathing, Surgical Site Infections.

## 2019-07-25 NOTE — Progress Notes (Signed)
[id number] [address], [place] [address] Phone: [phone] Fax: [phone]      Your procedure is scheduled on January 20  Report to [facility] Main Entrance "A" at 0800 A.M., and check in at the Admitting office.  Call this number if you have problems the morning of surgery:  [phone]  Call [phone] if you have any questions prior to your surgery date Monday-Friday 8am-4pm    Remember:  Do not eat or drink after midnight the night before your surgery    Take these medicines the morning of surgery with A SIP OF WATER  DILT-XR  flecainide (TAMBOCOR)  Aspirin plavix Ezetimibe-simvastatin (vytorin)   Your Last Dose of Xarelto should be on 07/24/19  7 days prior to surgery STOP taking any  Aleve, Naproxen, Ibuprofen, Motrin, Advil, Goody's, BC's, all herbal medications, fish oil, and all vitamins.    The Morning of Surgery  Do not wear jewelry  Do not wear lotions, powders, or /colognes, or deodorant  Do not shave 48 hours prior to surgery.  Men may shave face and neck.  Do not bring valuables to the hospital.  [facility] is not responsible for any belongings or valuables.  If you are a smoker, DO NOT Smoke 24 hours prior to surgery  If you wear a CPAP at night please bring your mask, tubing, and machine the morning of surgery   Remember that you must have someone to transport you home after your surgery, and remain with you for 24 hours if you are discharged the same day.   Please bring cases for contacts, glasses, hearing aids, dentures or bridgework because it cannot be worn into surgery.    Leave your suitcase in the car.  After surgery it may be brought to your room.  For patients admitted to the hospital, discharge time will be determined by your treatment team.  Patients discharged the day of surgery will not be allowed to drive home.    Special instructions:   [place]-  Preparing For Surgery  Before surgery, you can play an important role. Because skin is not sterile, your skin needs to be as free of germs as possible. You can reduce the number of germs on your skin by washing with CHG (chlorahexidine gluconate) Soap before surgery.  CHG is an antiseptic cleaner which kills germs and bonds with the skin to continue killing germs even after washing.    Oral Hygiene is also important to reduce your risk of infection.  Remember - BRUSH YOUR TEETH THE MORNING OF SURGERY WITH YOUR REGULAR TOOTHPASTE  Please do not use if you have an allergy to CHG or antibacterial soaps. If your skin becomes reddened/irritated stop using the CHG.  Do not shave (including legs and underarms) for at least 48 hours prior to first CHG shower. It is OK to shave your face.  Please follow these instructions carefully.   1. Shower the NIGHT BEFORE SURGERY and the MORNING OF SURGERY with CHG Soap.   2. If you chose to wash your hair, wash your hair first as usual with your normal shampoo.  3. After you shampoo, rinse your hair and body thoroughly to remove the shampoo.  4. Use CHG as you would any other liquid soap. You can apply CHG directly to the skin and wash gently with a scrungie or a clean washcloth.   5. Apply the CHG Soap to your body ONLY FROM  THE NECK DOWN.  Do not use on open wounds or open sores. Avoid contact with your eyes, ears, mouth and genitals (private parts). Wash Face and genitals (private parts)  with your normal soap.   6. Wash thoroughly, paying special attention to the area where your surgery will be performed.  7. Thoroughly rinse your body with warm water from the neck down.  8. DO NOT shower/wash with your normal soap after using and rinsing off the CHG Soap.  9. Pat yourself dry with a CLEAN TOWEL.  10. Wear CLEAN PAJAMAS to bed the night before surgery, wear comfortable clothes the morning of surgery  11. Place CLEAN SHEETS on your bed the night of  your first shower and DO NOT SLEEP WITH PETS.    Day of Surgery:  Please shower the morning of surgery with the CHG soap Do not apply any deodorants/lotions. Please wear clean clothes to the hospital/surgery center.   Remember to brush your teeth WITH YOUR REGULAR TOOTHPASTE.   Please read over the following fact sheets that you were given.

## 2019-07-25 NOTE — Progress Notes (Addendum)
PCP - Dr Pearson Grippe  Cardiologist - Dr Jacinto Halim  Chest x-ray - na  EKG - 07/09/2019   Stress Test - no  ECHO -   Cardiac Cath -   Sleep Study - no CPAP - no LABS-  ASA- 81 mg, Plavix - continue Hold Xarelto 48 hours- last dose 07/24/2019  ERAS-no  HA1C-na Fasting Blood Sugar - na Checks Blood Sugar _0_Anesthesia-  Pt denies having chest pain, sob, or fever at this time. All instructions explained to the pt, with a verbal understanding of the material. Pt agrees to go over the instructions while at home for a better understanding. Pt also instructed to self quarantine after being tested for COVID-19. The opportunity to ask questions was provided.

## 2019-07-25 NOTE — Progress Notes (Signed)
Anesthesia Chart Review:  Follows with Dr. Jacinto Halim for history of paroxysmal afib and carotid stenosis. Discussed upcoming TCAR at last OV 07/19/19.   Preop labs reviewed, WNL.  EKG 07/19/19: Sinus bradycardia at rate of 56 bpm, LAE,  normal axis.  Poor R-wave progression, probably normal variant.  No evidence of ischemia.   CTA neck 08/01/2019: 1. Critical, flow-limiting stenosis proximal left internal carotid artery due to atherosclerotic disease. Estimated 90% diameter stenosis proximal left internal carotid artery 2. Atherosclerotic disease right carotid bifurcation with less than 25% diameter stenosis right internal carotid artery. 3. Moderate stenosis origin of left vertebral artery. Right vertebral artery widely patent without stenosis.  Carotid artery duplex 06/23/2019: Stenosis in the right internal carotid artery (16-49%). Critical stenosis in the left internal carotid artery (near occlusion). Reduced Doppler flow signal without velocity elevation. Stenosis severity may be an error due to heterogeneous plaque and calcification. Stenosis in the left external carotid artery (<50%). Antegrade right vertebral artery flow. Antegrade left vertebral artery flow. Compared to the study done on 01/06/2019, there is progression of left ICA stenosis. Follow up in four months is appropriate if clinically indicated. Consider angiography (CT vs invasive).   Event Monitor 06/16/12 through 07/15/2012: Paroxysmal episodes of A. Fibrillation and A. Flutter, asymptomatic. Mostly rate controlled.  Exercise sestamibi 2012: Exercise duration 12 min, 15 METs. Diaphragmatic attenuation. No ischemia. EF 64%. low risk.  Echo 06/12/11: Normal LVEF. Mild LVH. Trace AI.  Douglas Griffith, Douglas Griffith [facility] Short Stay Center/Anesthesiology Phone [phone] 07/25/2019 3:20 PM

## 2019-07-25 NOTE — Pre-Procedure Instructions (Signed)
Douglas Griffith  07/25/2019     Your procedure is scheduled on Wednesday, January 20             Report to [facility], Main Entrance or Entrance "A" at 8:00 AM    Call this number if you have problems the morning of surgery:[phone]  This is the Pre- Surgery Desk                For any other questions, please call [phone], Monday - Friday 8 AM - 4 PM.    Remember:  Do not eat or drink after midnight Tuesday, January 19.  Take these medicines the morning of surgery with A SIP OF WATER : aspirin EC  DILT-XR ezetimibe-simvastatin (VYTORIN) flecainide (TAMBOCOR)  Hold Xarelto as instructed by your surgeon  - 48 hours prior to surgery.  STOP taking Aspirin, Aspirin Products (Goody Powder, Excedrin Migraine), Ibuprofen (Advil), Naproxen (Aleve), Vitamins and Herbal Products (ie Fish Oil).  Special instructions:   [place]- Preparing For Surgery  Before surgery, you can play an important role. Because skin is not sterile, your skin needs to be as free of germs as possible. You can reduce the number of germs on your skin by washing with CHG (chlorahexidine gluconate) Soap before surgery.  CHG is an antiseptic cleaner which kills germs and bonds with the skin to continue killing germs even after washing.    Oral Hygiene is also important to reduce your risk of infection.  Remember - BRUSH YOUR TEETH THE MORNING OF SURGERY WITH YOUR REGULAR TOOTHPASTE  Please do not use if you have an allergy to CHG or antibacterial soaps. If your skin becomes reddened/irritated stop using the CHG.  Do not shave (including legs and underarms) for at least 48 hours prior to first CHG shower. It is OK to shave your face.  Please follow these instructions carefully.   1. Shower the NIGHT BEFORE SURGERY and the MORNING OF SURGERY with CHG.   2. If you chose to wash your hair, wash your hair first as usual with your normal shampoo.  3. After you shampoo, rinse your hair and body  thoroughly to remove the shampoo.  4. Use CHG as you would any other liquid soap. You can apply CHG directly to the skin and wash gently with a scrungie or a clean washcloth.   5. Apply the CHG Soap to your body ONLY FROM THE NECK DOWN.  Do not use on open wounds or open sores. Avoid contact with your eyes, ears, mouth and genitals (private parts). Wash Face and genitals (private parts)  with your normal soap.  6. Wash thoroughly, paying special attention to the area where your surgery will be performed.  7. Thoroughly rinse your body with warm water from the neck down.  8. DO NOT shower/wash with your normal soap after using and rinsing off the CHG Soap.  9. Pat yourself dry with a CLEAN TOWEL.  10. Wear CLEAN PAJAMAS to bed the night before surgery, wear comfortable clothes the morning of surgery  11. Place CLEAN SHEETS on your bed the night of your first shower and DO NOT SLEEP WITH PETS.  Day of Surgery:             Shower as instructed above. Do not wear lotions, powders, or perfumes, or deodorant. Please wear clean clothes to the hospital/surgery center.   Remember to brush your teeth WITH YOUR REGULAR TOOTHPASTE  Do not wear jewelry,  make-up or nail polish.  Do not wear lotions, powders, or perfumes, or deodorant.  Men may shave face and neck.  Do not bring valuables to the hospital.  [facility] is not responsible for any belongings or valuables.  Contacts, dentures or bridgework may not be worn into surgery.  Leave your suitcase in the car.  After surgery it may be brought to your room.  Please read over the following fact sheets that you were given: .Pain Booklet, Patient Instructions for Mupirocin Application, Coughing and Deep Breathing, Surgical Site Infections.

## 2019-07-26 ENCOUNTER — Telehealth: Payer: Self-pay | Admitting: Physician Assistant

## 2019-07-26 ENCOUNTER — Telehealth: Payer: Self-pay | Admitting: *Deleted

## 2019-07-26 NOTE — Telephone Encounter (Signed)
Procedure cancelled due to patient COVID +. Will be rescheduled. Resume Xarelto and continue ASA, Plavix, statin per Dr. Myra Gianotti. Patient verbalized understanding. Will monitor self for any signs of bleeding. Reviewed [place] S of stroke/TIA and to get emergency treatment.

## 2019-07-26 NOTE — Progress Notes (Addendum)
Attempted to call positive covid results to Dr. Estanislado Spire office- open at 0900. Will call back after 0900- staff message sent to [facility] 0909 spoke with Kriste Basque at MD office regarding results

## 2019-07-26 NOTE — Telephone Encounter (Signed)
Called to discuss with Douglas Griffith about Covid symptoms and the potential use of bamlanivimab, a monoclonal antibody infusion for those with mild to moderate Covid symptoms and at a high risk of hospitalization.     Pt does not qualify for infusion therapy as he has asymptomatic infection. Isolation precautions discussed. Advised to contact back for consideration should he develop symptoms. Patient verbalized understanding.    Rutherford Guys, PA - C 07/26/2019, 10:30 AM

## 2019-07-27 ENCOUNTER — Inpatient Hospital Stay (HOSPITAL_COMMUNITY): Admission: RE | Admit: 2019-07-27 | Payer: Medicare Other | Source: Home / Self Care | Admitting: Surgery

## 2019-07-27 ENCOUNTER — Encounter (HOSPITAL_COMMUNITY): Admission: RE | Payer: Self-pay | Source: Home / Self Care

## 2019-07-27 HISTORY — DX: Cardiac arrhythmia, unspecified: I49.9

## 2019-07-27 SURGERY — TRANSCAROTID ARTERY REVASCULARIZATION (TCAR)
Anesthesia: General | Laterality: Left

## 2019-08-01 ENCOUNTER — Other Ambulatory Visit (HOSPITAL_COMMUNITY): Payer: Medicare Other

## 2019-08-01 ENCOUNTER — Encounter: Payer: Self-pay | Admitting: *Deleted

## 2019-08-01 ENCOUNTER — Other Ambulatory Visit: Payer: Self-pay | Admitting: *Deleted

## 2019-08-01 NOTE — Progress Notes (Signed)
Call to patient and TCAR re-scheduled for 08/10/2019 with Dr. Myra Gianotti. Reviewed all instructions for surgery (see letter) Xarelto hold x 48 hours pre-op. Verbalized understanding.

## 2019-08-02 ENCOUNTER — Encounter: Payer: Self-pay | Admitting: *Deleted

## 2019-08-02 NOTE — Progress Notes (Signed)
TCAR surgery rescheduled 16 days after positive Covid -19 nasal swab testing. Patient states completely asymptomatic. Instructions given for 08/10/2019 surgery with Dr. Myra Gianotti.

## 2019-08-08 ENCOUNTER — Encounter (HOSPITAL_COMMUNITY): Payer: Self-pay | Admitting: Surgery

## 2019-08-08 NOTE — Progress Notes (Signed)
Spoke with pt for pre-op call. Pt had a PAT appt on 07/25/19 for this surgery, but tested positive for Covid. Pt's surgery rescheduled for 08/10/19. Pt states he was completely asymptomatic with Covid. Pt states he had his 1st Covid vaccine 5 days prior to the positive test. He is scheduled to get his 2nd Covid vaccine tomorrow morning.  Pt's last dose of Xarelto was Sunday, 08/07/19. He is to continue his Aspirin and Plavix and to take the day of surgery.

## 2019-08-09 ENCOUNTER — Ambulatory Visit: Payer: Medicare Other | Attending: Internal Medicine

## 2019-08-09 DIAGNOSIS — Z23 Encounter for immunization: Secondary | ICD-10-CM | POA: Insufficient documentation

## 2019-08-09 NOTE — Progress Notes (Signed)
   Covid-19 Vaccination Clinic  Name:  Douglas Griffith    MRN: [id number] DOB: [birth year]  08/09/2019  [name] was observed post Covid-19 immunization for 15 minutes without incidence. He was provided with Vaccine Information Sheet and instruction to access the V-Safe system.   [name] was instructed to call 911 with any severe reactions post vaccine: Marland Kitchen Difficulty breathing  . Swelling of your face and throat  . A fast heartbeat  . A bad rash all over your body  . Dizziness and weakness    Immunizations Administered    Name Date Dose VIS Date Route   Pfizer COVID-19 Vaccine 08/09/2019  9:04 AM 0.3 mL 06/17/2019 Intramuscular   Manufacturer: [employer], [employer]   Lot: [id number]   NDC: [address]-1

## 2019-08-09 NOTE — Anesthesia Preprocedure Evaluation (Addendum)
Anesthesia Evaluation  Patient identified by MRN, date of birth, ID band Patient awake    Reviewed: Allergy & Precautions, NPO status , Patient's Chart, lab work & pertinent test results  Airway Mallampati: III  TM Distance: >3 FB Neck ROM: Full    Dental no notable dental hx.    Pulmonary former smoker,    Pulmonary exam normal breath sounds clear to auscultation       Cardiovascular hypertension, Normal cardiovascular exam+ dysrhythmias Atrial Fibrillation  Rhythm:Regular Rate:Normal  ECG: SB, rate 56   Neuro/Psych negative neurological ROS  negative psych ROS   GI/Hepatic Neg liver ROS, GERD  Medicated and Controlled,  Endo/Other  negative endocrine ROS  Renal/GU negative Renal ROS     Musculoskeletal negative musculoskeletal ROS (+)   Abdominal   Peds  Hematology HLD   Anesthesia Other Findings LEFT CAROTID ARTERY STENOSIS  Reproductive/Obstetrics                          Anesthesia Physical Anesthesia Plan  ASA: III  Anesthesia Plan: General   Post-op Pain Management:    Induction: Intravenous  PONV Risk Score and Plan: 2 and Ondansetron, Dexamethasone and Treatment may vary due to age or medical condition  Airway Management Planned: Oral ETT  Additional Equipment: Arterial line  Intra-op Plan:   Post-operative Plan: Extubation in OR  Informed Consent: I have reviewed the patients History and Physical, chart, labs and discussed the procedure including the risks, benefits and alternatives for the proposed anesthesia with the patient or authorized representative who has indicated his/her understanding and acceptance.     Dental advisory given  Plan Discussed with: CRNA  Anesthesia Plan Comments:        Anesthesia Quick Evaluation

## 2019-08-10 ENCOUNTER — Inpatient Hospital Stay (HOSPITAL_COMMUNITY)
Admission: RE | Admit: 2019-08-10 | Discharge: 2019-08-11 | DRG: 982 | Disposition: A | Discharge status: Home / Self Care | Payer: Medicare Other | Attending: Surgery | Admitting: Surgery

## 2019-08-10 ENCOUNTER — Other Ambulatory Visit: Payer: Self-pay

## 2019-08-10 ENCOUNTER — Inpatient Hospital Stay (HOSPITAL_COMMUNITY): Payer: Medicare Other

## 2019-08-10 ENCOUNTER — Encounter (HOSPITAL_COMMUNITY): Payer: Self-pay | Admitting: Surgery

## 2019-08-10 ENCOUNTER — Encounter (HOSPITAL_COMMUNITY)
Admission: RE | Disposition: A | Discharge status: Home / Self Care | Payer: Self-pay | Source: Home / Self Care | Attending: Surgery

## 2019-08-10 DIAGNOSIS — E7800 Pure hypercholesterolemia, unspecified: Secondary | ICD-10-CM | POA: Diagnosis not present

## 2019-08-10 DIAGNOSIS — I4892 Unspecified atrial flutter: Secondary | ICD-10-CM | POA: Diagnosis present

## 2019-08-10 DIAGNOSIS — Z7901 Long term (current) use of anticoagulants: Secondary | ICD-10-CM

## 2019-08-10 DIAGNOSIS — Z7902 Long term (current) use of antithrombotics/antiplatelets: Secondary | ICD-10-CM

## 2019-08-10 DIAGNOSIS — Z7982 Long term (current) use of aspirin: Secondary | ICD-10-CM

## 2019-08-10 DIAGNOSIS — I63232 Cerebral infarction due to unspecified occlusion or stenosis of left carotid arteries: Secondary | ICD-10-CM | POA: Diagnosis not present

## 2019-08-10 DIAGNOSIS — Z79899 Other long term (current) drug therapy: Secondary | ICD-10-CM | POA: Diagnosis not present

## 2019-08-10 DIAGNOSIS — Z8249 Family history of ischemic heart disease and other diseases of the circulatory system: Secondary | ICD-10-CM | POA: Diagnosis not present

## 2019-08-10 DIAGNOSIS — G453 Amaurosis fugax: Principal | ICD-10-CM | POA: Diagnosis present

## 2019-08-10 DIAGNOSIS — I10 Essential (primary) hypertension: Secondary | ICD-10-CM | POA: Diagnosis not present

## 2019-08-10 DIAGNOSIS — Z87891 Personal history of nicotine dependence: Secondary | ICD-10-CM

## 2019-08-10 DIAGNOSIS — K219 Gastro-esophageal reflux disease without esophagitis: Secondary | ICD-10-CM | POA: Diagnosis present

## 2019-08-10 DIAGNOSIS — I480 Paroxysmal atrial fibrillation: Secondary | ICD-10-CM | POA: Diagnosis present

## 2019-08-10 DIAGNOSIS — I6522 Occlusion and stenosis of left carotid artery: Secondary | ICD-10-CM | POA: Diagnosis not present

## 2019-08-10 DIAGNOSIS — Z20822 Contact with and (suspected) exposure to covid-19: Secondary | ICD-10-CM | POA: Diagnosis present

## 2019-08-10 HISTORY — PX: TRANSCAROTID ARTERY REVASCULARIZATIONÂ: SHX6778

## 2019-08-10 LAB — COMPREHENSIVE METABOLIC PANEL
ALT: 22 U/L (ref 0–44)
AST: 23 U/L (ref 15–41)
Albumin: 3.9 g/dL (ref 3.5–5.0)
Alkaline Phosphatase: 46 U/L (ref 38–126)
Anion gap: 14 (ref 5–15)
BUN: 21 mg/dL (ref 8–23)
CO2: 23 mmol/L (ref 22–32)
Calcium: 9.1 mg/dL (ref 8.9–10.3)
Chloride: 103 mmol/L (ref 98–111)
Creatinine, Ser: 0.98 mg/dL (ref 0.61–1.24)
GFR calc Af Amer: >60 mL/min (ref >60)
GFR calc non Af Amer: >60 mL/min (ref >60)
Glucose, Bld: 98 mg/dL (ref 70–99)
Potassium: 3.9 mmol/L (ref 3.5–5.1)
Sodium: 140 mmol/L (ref 135–145)
Total Bilirubin: 1 mg/dL (ref 0.3–1.2)
Total Protein: 6.9 g/dL (ref 6.5–8.1)

## 2019-08-10 LAB — URINALYSIS, ROUTINE W REFLEX MICROSCOPIC
Bacteria, UA: NONE SEEN
Bilirubin Urine: NEGATIVE
Glucose, UA: NEGATIVE mg/dL
Ketones, ur: NEGATIVE mg/dL
Leukocytes,Ua: NEGATIVE
Nitrite: NEGATIVE
Protein, ur: NEGATIVE mg/dL
Specific Gravity, Urine: 1.027 (ref 1.005–1.030)
pH: 5 (ref 5.0–8.0)

## 2019-08-10 LAB — CBC
HCT: 39.9 % (ref 39.0–52.0)
Hemoglobin: 12.7 g/dL — ABNORMAL LOW (ref 13.0–17.0)
MCH: 30.9 pg (ref 26.0–34.0)
MCHC: 31.8 g/dL (ref 30.0–36.0)
MCV: 97.1 fL (ref 80.0–100.0)
Platelets: 296 10*3/uL (ref 150–400)
RBC: 4.11 MIL/uL — ABNORMAL LOW (ref 4.22–5.81)
RDW: 12.3 % (ref 11.5–15.5)
WBC: 5.8 10*3/uL (ref 4.0–10.5)
nRBC: 0 % (ref 0.0–0.2)

## 2019-08-10 LAB — PROTIME-INR
INR: 1.2 (ref 0.8–1.2)
Prothrombin Time: 14.8 seconds (ref 11.4–15.2)

## 2019-08-10 LAB — TYPE AND SCREEN
ABO/RH(D): A POS
Antibody Screen: NEGATIVE

## 2019-08-10 LAB — POCT ACTIVATED CLOTTING TIME
Activated Clotting Time: 285 seconds
Activated Clotting Time: 257 seconds

## 2019-08-10 LAB — APTT: aPTT: 30 seconds (ref 24–36)

## 2019-08-10 SURGERY — TRANSCAROTID ARTERY REVASCULARIZATION (TCAR)
Anesthesia: General | Laterality: Left

## 2019-08-10 MED ORDER — SODIUM CHLORIDE 0.9 % IV SOLN: INTRAVENOUS | Status: DC

## 2019-08-10 MED ORDER — LIDOCAINE 2% (20 MG/ML) 5 ML SYRINGE
INTRAMUSCULAR | Status: AC
  Filled 2019-08-10: qty 5

## 2019-08-10 MED ORDER — ASPIRIN EC 81 MG PO TBEC
81.0000 mg | DELAYED_RELEASE_TABLET | Freq: Every day | ORAL | Status: DC
  Administered 2019-08-11: 81 mg via ORAL
  Filled 2019-08-10: qty 1

## 2019-08-10 MED ORDER — SUGAMMADEX SODIUM 200 MG/2ML IV SOLN
INTRAVENOUS | Status: DC | PRN
  Administered 2019-08-10: 200 mg via INTRAVENOUS

## 2019-08-10 MED ORDER — EPHEDRINE SULFATE 50 MG/ML IJ SOLN
INTRAMUSCULAR | Status: DC | PRN
  Administered 2019-08-10 (×2): 5 mg via INTRAVENOUS

## 2019-08-10 MED ORDER — ONDANSETRON HCL 4 MG/2ML IJ SOLN
INTRAMUSCULAR | Status: DC | PRN
  Administered 2019-08-10: 4 mg via INTRAVENOUS

## 2019-08-10 MED ORDER — ZOLPIDEM TARTRATE 5 MG PO TABS: 5.0000 mg | ORAL_TABLET | Freq: Every evening | ORAL | Status: DC | PRN

## 2019-08-10 MED ORDER — POTASSIUM CHLORIDE CRYS ER 20 MEQ PO TBCR: 20.0000 meq | EXTENDED_RELEASE_TABLET | Freq: Every day | ORAL | Status: DC | PRN

## 2019-08-10 MED ORDER — MAGNESIUM SULFATE 2 GM/50ML IV SOLN: 2.0000 g | Freq: Every day | INTRAVENOUS | Status: DC | PRN

## 2019-08-10 MED ORDER — CHLORHEXIDINE GLUCONATE 4 % EX LIQD: 60.0000 mL | Freq: Once | CUTANEOUS | Status: DC

## 2019-08-10 MED ORDER — MIDAZOLAM HCL 5 MG/5ML IJ SOLN
INTRAMUSCULAR | Status: DC | PRN
  Administered 2019-08-10: 1 mg via INTRAVENOUS

## 2019-08-10 MED ORDER — FAMOTIDINE 20 MG PO TABS: 10.0000 mg | ORAL_TABLET | Freq: Every day | ORAL | Status: DC

## 2019-08-10 MED ORDER — ONDANSETRON HCL 4 MG/2ML IJ SOLN
INTRAMUSCULAR | Status: AC
  Filled 2019-08-10: qty 2

## 2019-08-10 MED ORDER — FLECAINIDE ACETATE 50 MG PO TABS: 100.0000 mg | ORAL_TABLET | Freq: Two times a day (BID) | ORAL | Status: DC

## 2019-08-10 MED ORDER — IODIXANOL 320 MG/ML IV SOLN
INTRAVENOUS | Status: DC | PRN
  Administered 2019-08-10 (×2): 50 mL

## 2019-08-10 MED ORDER — ATROPINE SULFATE 0.4 MG/ML IJ SOLN
INTRAMUSCULAR | Status: DC | PRN
  Administered 2019-08-10 (×3): .2 mg via INTRAVENOUS
  Administered 2019-08-10: .4 mg via INTRAVENOUS
  Administered 2019-08-10: .2 mg via INTRAVENOUS
  Administered 2019-08-10 (×2): .4 mg via INTRAVENOUS

## 2019-08-10 MED ORDER — BISACODYL 5 MG PO TBEC: 5.0000 mg | DELAYED_RELEASE_TABLET | Freq: Every day | ORAL | Status: DC | PRN

## 2019-08-10 MED ORDER — PROTAMINE SULFATE 10 MG/ML IV SOLN
INTRAVENOUS | Status: AC
  Filled 2019-08-10: qty 25

## 2019-08-10 MED ORDER — HYDRALAZINE HCL 20 MG/ML IJ SOLN: 5.0000 mg | INTRAMUSCULAR | Status: DC | PRN

## 2019-08-10 MED ORDER — LACTATED RINGERS IV SOLN: INTRAVENOUS | Status: DC | PRN

## 2019-08-10 MED ORDER — SENNOSIDES-DOCUSATE SODIUM 8.6-50 MG PO TABS: 1.0000 | ORAL_TABLET | Freq: Every evening | ORAL | Status: DC | PRN

## 2019-08-10 MED ORDER — FENTANYL CITRATE (PF) 100 MCG/2ML IJ SOLN: 25.0000 ug | INTRAMUSCULAR | Status: DC | PRN

## 2019-08-10 MED ORDER — DEXAMETHASONE SODIUM PHOSPHATE 10 MG/ML IJ SOLN
INTRAMUSCULAR | Status: DC | PRN
  Administered 2019-08-10: 10 mg via INTRAVENOUS

## 2019-08-10 MED ORDER — ACETAMINOPHEN 325 MG PO TABS: 325.0000 mg | ORAL_TABLET | ORAL | Status: DC | PRN

## 2019-08-10 MED ORDER — ONDANSETRON HCL 4 MG/2ML IJ SOLN: 4.0000 mg | Freq: Four times a day (QID) | INTRAMUSCULAR | Status: DC | PRN

## 2019-08-10 MED ORDER — HEPARIN SODIUM (PORCINE) 1000 UNIT/ML IJ SOLN
INTRAMUSCULAR | Status: DC | PRN
  Administered 2019-08-10: 9000 [IU] via INTRAVENOUS

## 2019-08-10 MED ORDER — METOPROLOL TARTRATE 5 MG/5ML IV SOLN: 2.0000 mg | INTRAVENOUS | Status: DC | PRN

## 2019-08-10 MED ORDER — EZETIMIBE-SIMVASTATIN 10-40 MG PO TABS: 1.0000 | ORAL_TABLET | Freq: Every day | ORAL | Status: DC

## 2019-08-10 MED ORDER — ONDANSETRON HCL 4 MG/2ML IJ SOLN: 4.0000 mg | Freq: Once | INTRAMUSCULAR | Status: DC | PRN

## 2019-08-10 MED ORDER — PROPOFOL 10 MG/ML IV BOLUS
INTRAVENOUS | Status: DC | PRN
  Administered 2019-08-10: 100 mg via INTRAVENOUS

## 2019-08-10 MED ORDER — PANTOPRAZOLE SODIUM 40 MG PO TBEC
40.0000 mg | DELAYED_RELEASE_TABLET | Freq: Every day | ORAL | Status: DC
  Administered 2019-08-10 – 2019-08-11 (×2): 40 mg via ORAL
  Filled 2019-08-10 (×3): qty 1

## 2019-08-10 MED ORDER — SIMVASTATIN 20 MG PO TABS: 40.0000 mg | ORAL_TABLET | Freq: Every day | ORAL | Status: DC

## 2019-08-10 MED ORDER — SODIUM CHLORIDE 0.9 % IV SOLN: 500.0000 mL | Freq: Once | INTRAVENOUS | Status: DC | PRN

## 2019-08-10 MED ORDER — SUCCINYLCHOLINE CHLORIDE 200 MG/10ML IV SOSY
PREFILLED_SYRINGE | INTRAVENOUS | Status: AC
  Filled 2019-08-10: qty 10

## 2019-08-10 MED ORDER — ROCURONIUM BROMIDE 50 MG/5ML IV SOSY
PREFILLED_SYRINGE | INTRAVENOUS | Status: DC | PRN
  Administered 2019-08-10: 60 mg via INTRAVENOUS

## 2019-08-10 MED ORDER — EZETIMIBE 10 MG PO TABS
10.0000 mg | ORAL_TABLET | Freq: Every day | ORAL | Status: DC
  Administered 2019-08-11: 10 mg via ORAL
  Filled 2019-08-10: qty 1

## 2019-08-10 MED ORDER — GLYCOPYRROLATE 0.2 MG/ML IJ SOLN
INTRAMUSCULAR | Status: DC | PRN
  Administered 2019-08-10 (×2): .2 mg via INTRAVENOUS

## 2019-08-10 MED ORDER — FENTANYL CITRATE (PF) 100 MCG/2ML IJ SOLN
INTRAMUSCULAR | Status: DC | PRN
  Administered 2019-08-10: 75 ug via INTRAVENOUS
  Administered 2019-08-10: 25 ug via INTRAVENOUS
  Administered 2019-08-10: 50 ug via INTRAVENOUS

## 2019-08-10 MED ORDER — DILTIAZEM HCL ER COATED BEADS 180 MG PO CP24
180.0000 mg | ORAL_CAPSULE | Freq: Every day | ORAL | Status: DC
  Administered 2019-08-11: 180 mg via ORAL
  Filled 2019-08-10 (×2): qty 1

## 2019-08-10 MED ORDER — HYDROMORPHONE HCL 1 MG/ML IJ SOLN: 0.5000 mg | INTRAMUSCULAR | Status: DC | PRN

## 2019-08-10 MED ORDER — FLEET ENEMA 7-19 GM/118ML RE ENEM: 1.0000 | ENEMA | Freq: Once | RECTAL | Status: DC | PRN

## 2019-08-10 MED ORDER — ROCURONIUM BROMIDE 10 MG/ML (PF) SYRINGE
PREFILLED_SYRINGE | INTRAVENOUS | Status: AC
  Filled 2019-08-10: qty 10

## 2019-08-10 MED ORDER — SODIUM CHLORIDE 0.9 % IV SOLN
INTRAVENOUS | Status: AC
  Filled 2019-08-10: qty 1.2

## 2019-08-10 MED ORDER — 0.9 % SODIUM CHLORIDE (POUR BTL) OPTIME
TOPICAL | Status: DC | PRN
  Administered 2019-08-10: 1000 mL

## 2019-08-10 MED ORDER — GLYCOPYRROLATE PF 0.2 MG/ML IJ SOSY
PREFILLED_SYRINGE | INTRAMUSCULAR | Status: AC
  Filled 2019-08-10: qty 1

## 2019-08-10 MED ORDER — CEFAZOLIN SODIUM-DEXTROSE 2-4 GM/100ML-% IV SOLN
2.0000 g | INTRAVENOUS | Status: AC
  Administered 2019-08-10: 2 g via INTRAVENOUS
  Filled 2019-08-10: qty 100

## 2019-08-10 MED ORDER — HEPARIN SODIUM (PORCINE) 5000 UNIT/ML IJ SOLN
5000.0000 [IU] | Freq: Three times a day (TID) | INTRAMUSCULAR | Status: DC
  Administered 2019-08-10 – 2019-08-11 (×2): 5000 [IU] via SUBCUTANEOUS
  Filled 2019-08-10 (×2): qty 1

## 2019-08-10 MED ORDER — ACETAMINOPHEN 500 MG PO TABS
1000.0000 mg | ORAL_TABLET | Freq: Once | ORAL | Status: AC
  Administered 2019-08-10: 1000 mg via ORAL
  Filled 2019-08-10: qty 2

## 2019-08-10 MED ORDER — PROPOFOL 10 MG/ML IV BOLUS
INTRAVENOUS | Status: AC
  Filled 2019-08-10: qty 40

## 2019-08-10 MED ORDER — DEXAMETHASONE SODIUM PHOSPHATE 10 MG/ML IJ SOLN
INTRAMUSCULAR | Status: AC
  Filled 2019-08-10: qty 1

## 2019-08-10 MED ORDER — FLECAINIDE ACETATE 50 MG PO TABS
50.0000 mg | ORAL_TABLET | Freq: Two times a day (BID) | ORAL | Status: DC
  Administered 2019-08-10 – 2019-08-11 (×2): 50 mg via ORAL
  Filled 2019-08-10 (×2): qty 1

## 2019-08-10 MED ORDER — PROTAMINE SULFATE 10 MG/ML IV SOLN
INTRAVENOUS | Status: DC | PRN
  Administered 2019-08-10: 50 mg via INTRAVENOUS

## 2019-08-10 MED ORDER — OXYCODONE-ACETAMINOPHEN 5-325 MG PO TABS: 1.0000 | ORAL_TABLET | ORAL | Status: DC | PRN

## 2019-08-10 MED ORDER — ACETAMINOPHEN 325 MG RE SUPP: 325.0000 mg | RECTAL | Status: DC | PRN

## 2019-08-10 MED ORDER — DOCUSATE SODIUM 100 MG PO CAPS: 100.0000 mg | ORAL_CAPSULE | Freq: Every day | ORAL | Status: DC

## 2019-08-10 MED ORDER — LIDOCAINE HCL (PF) 1 % IJ SOLN
INTRAMUSCULAR | Status: AC
  Filled 2019-08-10: qty 30

## 2019-08-10 MED ORDER — FENTANYL CITRATE (PF) 250 MCG/5ML IJ SOLN
INTRAMUSCULAR | Status: AC
  Filled 2019-08-10: qty 5

## 2019-08-10 MED ORDER — HEMOSTATIC AGENTS (NO CHARGE) OPTIME
TOPICAL | Status: DC | PRN
  Administered 2019-08-10: 1 via TOPICAL

## 2019-08-10 MED ORDER — LIDOCAINE 2% (20 MG/ML) 5 ML SYRINGE
INTRAMUSCULAR | Status: DC | PRN
  Administered 2019-08-10: 60 mg via INTRAVENOUS

## 2019-08-10 MED ORDER — GUAIFENESIN-DM 100-10 MG/5ML PO SYRP: 15.0000 mL | ORAL_SOLUTION | ORAL | Status: DC | PRN

## 2019-08-10 MED ORDER — PHENYLEPHRINE HCL-NACL 10-0.9 MG/250ML-% IV SOLN
INTRAVENOUS | Status: DC | PRN
  Administered 2019-08-10: 115 ug/min via INTRAVENOUS

## 2019-08-10 MED ORDER — SODIUM CHLORIDE 0.9 % IV SOLN: INTRAVENOUS | Status: DC | PRN

## 2019-08-10 MED ORDER — LABETALOL HCL 5 MG/ML IV SOLN: 10.0000 mg | INTRAVENOUS | Status: DC | PRN

## 2019-08-10 MED ORDER — LOSARTAN POTASSIUM 50 MG PO TABS
50.0000 mg | ORAL_TABLET | Freq: Every day | ORAL | Status: DC
  Administered 2019-08-11: 50 mg via ORAL
  Filled 2019-08-10: qty 1

## 2019-08-10 MED ORDER — CLOPIDOGREL BISULFATE 75 MG PO TABS
75.0000 mg | ORAL_TABLET | Freq: Every day | ORAL | Status: DC
  Administered 2019-08-11: 75 mg via ORAL
  Filled 2019-08-10: qty 1

## 2019-08-10 MED ORDER — CEFAZOLIN SODIUM-DEXTROSE 2-4 GM/100ML-% IV SOLN
2.0000 g | Freq: Three times a day (TID) | INTRAVENOUS | Status: AC
  Administered 2019-08-10 – 2019-08-11 (×2): 2 g via INTRAVENOUS
  Filled 2019-08-10 (×2): qty 100

## 2019-08-10 MED ORDER — ALUM & MAG HYDROXIDE-SIMETH 200-200-20 MG/5ML PO SUSP: 15.0000 mL | ORAL | Status: DC | PRN

## 2019-08-10 MED ORDER — MIDAZOLAM HCL 2 MG/2ML IJ SOLN
INTRAMUSCULAR | Status: AC
  Filled 2019-08-10: qty 2

## 2019-08-10 MED ORDER — HEPARIN SODIUM (PORCINE) 1000 UNIT/ML IJ SOLN
INTRAMUSCULAR | Status: AC
  Filled 2019-08-10: qty 2

## 2019-08-10 MED ORDER — PHENOL 1.4 % MT LIQD: 1.0000 | OROMUCOSAL | Status: DC | PRN

## 2019-08-10 SURGICAL SUPPLY — 70 items
ADH SKN CLS APL DERMABOND .7 (GAUZE/BANDAGES/DRESSINGS) ×2
ADPR TBG 2 MALE LL ART (MISCELLANEOUS)
BAG BANDED W/RUBBER/TAPE 36X54 (MISCELLANEOUS) ×2 IMPLANT
BAG EQP BAND 135X91 W/RBR TAPE (MISCELLANEOUS) ×1
BALLN STERLING RX 5X30X80 (BALLOONS) ×2
BALLN STERLING RX 7X30X80 (BALLOONS) ×2
BALLOON STERLING RX 5X30X80 (BALLOONS) IMPLANT
BALLOON STERLING RX 7X30X80 (BALLOONS) IMPLANT
CANISTER SUCT 3000ML PPV (MISCELLANEOUS) ×2 IMPLANT
CATH ROBINSON RED A/P 18FR (CATHETERS) ×1 IMPLANT
CATH SUCT 10FR WHISTLE TIP (CATHETERS) ×2 IMPLANT
CLIP VESOCCLUDE MED 6/CT (CLIP) ×2 IMPLANT
CLIP VESOCCLUDE SM WIDE 6/CT (CLIP) ×2 IMPLANT
COVER DOME SNAP 22 D (MISCELLANEOUS) ×2 IMPLANT
COVER PROBE W GEL 5X96 (DRAPES) ×2 IMPLANT
COVER SURGICAL LIGHT HANDLE (MISCELLANEOUS) ×1 IMPLANT
COVER WAND RF STERILE (DRAPES) ×1 IMPLANT
DERMABOND ADVANCED (GAUZE/BANDAGES/DRESSINGS) ×2
DERMABOND ADVANCED .7 DNX12 (GAUZE/BANDAGES/DRESSINGS) ×1 IMPLANT
DRAPE FEMORAL ANGIO 80X135IN (DRAPES) ×2 IMPLANT
DRAPE INCISE IOBAN 66X45 STRL (DRAPES) ×4 IMPLANT
ELECT REM PT RETURN 9FT ADLT (ELECTROSURGICAL) ×2
ELECTRODE REM PT RTRN 9FT ADLT (ELECTROSURGICAL) ×1 IMPLANT
GLOVE BIO SURGEON STRL SZ 6.5 (GLOVE) ×2 IMPLANT
GLOVE BIOGEL M 7.0 STRL (GLOVE) ×1 IMPLANT
GLOVE BIOGEL PI IND STRL 6.5 (GLOVE) IMPLANT
GLOVE BIOGEL PI IND STRL 7.5 (GLOVE) ×1 IMPLANT
GLOVE BIOGEL PI INDICATOR 6.5 (GLOVE) ×3
GLOVE BIOGEL PI INDICATOR 7.5 (GLOVE) ×2
GLOVE SURG SS PI 7.5 STRL IVOR (GLOVE) ×3 IMPLANT
GOWN STRL REUS W/ TWL LRG LVL3 (GOWN DISPOSABLE) ×2 IMPLANT
GOWN STRL REUS W/ TWL XL LVL3 (GOWN DISPOSABLE) ×1 IMPLANT
GOWN STRL REUS W/TWL LRG LVL3 (GOWN DISPOSABLE) ×4
GOWN STRL REUS W/TWL XL LVL3 (GOWN DISPOSABLE) ×4
GUIDEWIRE ENROUTE 0.014 (WIRE) ×2 IMPLANT
HEMOSTAT SNOW SURGICEL 2X4 (HEMOSTASIS) IMPLANT
INTRODUCER KIT GALT 7CM (INTRODUCER) ×2
IV ADAPTER SYR DOUBLE MALE LL (MISCELLANEOUS) IMPLANT
KIT BASIN OR (CUSTOM PROCEDURE TRAY) ×2 IMPLANT
KIT ENCORE 26 ADVANTAGE (KITS) ×2 IMPLANT
KIT INTRODUCER GALT 7 (INTRODUCER) ×1 IMPLANT
KIT TURNOVER KIT B (KITS) ×2 IMPLANT
NDL HYPO 25GX1X1/2 BEV (NEEDLE) IMPLANT
NDL PERC 18GX7CM (NEEDLE) ×1 IMPLANT
NEEDLE HYPO 25GX1X1/2 BEV (NEEDLE) IMPLANT
NEEDLE PERC 18GX7CM (NEEDLE) ×2 IMPLANT
PACK CAROTID (CUSTOM PROCEDURE TRAY) ×2 IMPLANT
PAD ARMBOARD 7.5X6 YLW CONV (MISCELLANEOUS) ×4 IMPLANT
POSITIONER HEAD DONUT 9IN (MISCELLANEOUS) ×2 IMPLANT
PROTECTION STATION PRESSURIZED (MISCELLANEOUS) ×2
SET MICROPUNCTURE 5F STIFF (MISCELLANEOUS) ×1 IMPLANT
SHIELD RADPAD SCOOP 12X17 (MISCELLANEOUS) ×1 IMPLANT
STATION PROTECTION PRESSURIZED (MISCELLANEOUS) IMPLANT
STENT TRANSCAROTID SYSTEM 9X40 (Permanent Stent) ×1 IMPLANT
STOPCOCK 4 WAY LG BORE MALE ST (IV SETS) ×1 IMPLANT
SUT MNCRL AB 4-0 PS2 18 (SUTURE) IMPLANT
SUT PROLENE 5 0 C 1 24 (SUTURE) ×2 IMPLANT
SUT PROLENE 6 0 BV (SUTURE) ×4 IMPLANT
SUT SILK 2 0 PERMA HAND 18 BK (SUTURE) ×2 IMPLANT
SUT SILK 2 0 SH (SUTURE) ×2 IMPLANT
SUT VIC AB 3-0 SH 27 (SUTURE) ×4
SUT VIC AB 3-0 SH 27X BRD (SUTURE) ×2 IMPLANT
SYR 10ML LL (SYRINGE) ×6 IMPLANT
SYR 20ML LL LF (SYRINGE) ×2 IMPLANT
SYR CONTROL 10ML LL (SYRINGE) IMPLANT
SYSTEM TRANSCAROTID NEUROPRTCT (MISCELLANEOUS) ×1 IMPLANT
TOWEL GREEN STERILE (TOWEL DISPOSABLE) ×2 IMPLANT
TRANSCAROTID NEUROPROTECT SYS (MISCELLANEOUS) ×4
WATER STERILE IRR 1000ML POUR (IV SOLUTION) ×2 IMPLANT
WIRE BENTSON .035X145CM (WIRE) ×2 IMPLANT

## 2019-08-10 NOTE — Anesthesia Postprocedure Evaluation (Signed)
Anesthesia Post Note  Patient: Douglas Griffith  Procedure(s) Performed: Zada Finders ARTERY REVASCULARIZATION LEFT USING ENROUTE TRANSCAROTID STENT (Left )     Patient location during evaluation: PACU Anesthesia Type: General Level of consciousness: awake and alert Pain management: pain level controlled Vital Signs Assessment: post-procedure vital signs reviewed and stable Respiratory status: spontaneous breathing, nonlabored ventilation, respiratory function stable and patient connected to nasal cannula oxygen Cardiovascular status: blood pressure returned to baseline and stable Postop Assessment: no apparent nausea or vomiting Anesthetic complications: no    Last Vitals:  Vitals:   08/10/19 1342 08/10/19 1542  BP: 130/70 (!) 123/54  Pulse: 85 73  Resp: 20 18  Temp: 36.4 C 36.5 C  SpO2: 98% 99%    Last Pain:  Vitals:   08/10/19 1233  TempSrc: Oral  PainSc:                  Catheryn Bacon Corena Tilson

## 2019-08-10 NOTE — Anesthesia Procedure Notes (Signed)
Procedure Name: Intubation Date/Time: 08/10/2019 8:50 AM Performed by: Clearnce Sorrel, CRNA Pre-anesthesia Checklist: Patient identified, Emergency Drugs available, Suction available, Patient being monitored and Timeout performed Patient Re-evaluated:Patient Re-evaluated prior to induction Oxygen Delivery Method: Circle system utilized Preoxygenation: Pre-oxygenation with 100% oxygen Induction Type: IV induction Ventilation: Mask ventilation without difficulty and Oral airway inserted - appropriate to patient size Laryngoscope Size: Mac and 4 Grade View: Grade I Tube size: 7.5 mm Number of attempts: 1 Airway Equipment and Method: Stylet Placement Confirmation: ETT inserted through vocal cords under direct vision,  positive ETCO2 and breath sounds checked- equal and bilateral Secured at: 23 cm Tube secured with: Tape Dental Injury: Teeth and Oropharynx as per pre-operative assessment

## 2019-08-10 NOTE — Transfer of Care (Signed)
Immediate Anesthesia Transfer of Care Note  Patient: Douglas Griffith  Procedure(s) Performed: Zada Finders ARTERY REVASCULARIZATION LEFT USING ENROUTE TRANSCAROTID STENT (Left )  Patient Location: PACU  Anesthesia Type:General  Level of Consciousness: awake, alert  and oriented  Airway & Oxygen Therapy: Patient Spontanous Breathing  Post-op Assessment: Report given to RN and Post -op Vital signs reviewed and stable  Post vital signs: Reviewed and stable  Last Vitals:  Vitals Value Taken Time  BP 127/65 08/10/19 1101  Temp    Pulse 76 08/10/19 1102  Resp 11 08/10/19 1102  SpO2 100 % 08/10/19 1102  Vitals shown include unvalidated device data.  Last Pain:  Vitals:   08/10/19 0717  PainSc: 0-No pain         Complications: No apparent anesthesia complications

## 2019-08-10 NOTE — Anesthesia Procedure Notes (Signed)
Arterial Line Insertion Start/End2/09/2019 7:35 AM, 08/10/2019 7:45 AM Performed by: Dairl Ponder, CRNA, CRNA  Patient location: Pre-op. Preanesthetic checklist: patient identified, IV checked, site marked, risks and benefits discussed, surgical consent, monitors and equipment checked, pre-op evaluation, timeout performed and anesthesia consent Patient sedated Right, radial was placed Catheter size: 18 G Hand hygiene performed  and maximum sterile barriers used  Allen's test indicative of satisfactory collateral circulation Attempts: 3 Procedure performed without using ultrasound guided technique. Following insertion, Biopatch and dressing applied. Post procedure assessment: normal  Patient tolerated the procedure well with no immediate complications.

## 2019-08-10 NOTE — Op Note (Signed)
Patient name: Douglas Griffith MRN: [id number] DOB: Dec 08, 1945 Sex: male  08/10/2019 Pre-operative Diagnosis: Symptomatic left carotid stenosis Post-operative diagnosis:  Same Surgeon:  Durene Cal Assistants:  Adonis Housekeeper Procedure:   #1: Left transcarotid artery revascularization with stent placement (TCAR)    #2: Ultrasound-guided access, right common femoral vein   #3: Reverse flow neuro protection Anesthesia: General Blood Loss: 100 cc Specimens: None  Findings: Approximate 70-80% carotid stenosis.  We had difficulty visualizing the carotid bifurcation despite angiography in multiple obliquities. Stent: ENROUTE 9 x 40 Predilation balloon 5 x 30 Reverse flow time: 30 minutes Post dilation balloon 7 x 30 Contrast volume 51 cc Dose area 4146.56 Fluoroscopy time 5.9 minutes Procedure time 85 minutes  Indications: This is a 73 year old gentleman who has been followed for carotid stenosis.  His most recent ultrasound and CT scan identified a 90% stenosis.  He does report several episodes of what sounds like amaurosis.  His procedure was delayed because of an asymptomatic positive testing for Covid.  He is now 2 weeks out since his diagnosis and is here for surgery.  His anticoagulation has been held.  He is on aspirin Plavix and a statin.  Procedure:  The patient was identified in the holding area and taken to [facility] OR ROOM 16  The patient was then placed supine on the table. general anesthesia was administered.  The patient was prepped and draped in the usual sterile fashion.  A time out was called and antibiotics were administered.  Ultrasound was used to evaluate the right common femoral vein which is widely patent and easily compressible.  A #11 blade was used to make a skin nick.  The right common femoral vein was cannulated under ultrasound guidance the micropuncture needle.  An 018 wire was advanced without resistance.  A micropuncture sheath was then placed.  The 018 wire was removed  and a Bentson wire was inserted.  The micropuncture sheath was removed and the flow reversal femoral sheath was inserted and flushed.  Attention was then turned towards the left neck.  Ultrasound identified the location of the common carotid artery at the level of the clavicle.  A transverse incision was then made.  Cautery is about subcutaneous tissue and the platysma muscle.  The 2 heads of the sternocleidomastoid were identified and the avascular plane was entered.  The internal jugular vein was identified as well as the vagus nerve which was protected.  I then dissected out the common carotid artery.  It was encircled proximally with an umbilical tape and a Potts vessel loop.  A 5-0 Prolene was then placed in a U stitch fashion in the adventitia.  The patient was fully heparinized.  Heparin levels were monitored with ACT measurements which were greater than 250.  A Ray-Tec was then placed behind the common carotid artery to elevated.  The common carotid arteries and cannulated with a micropuncture needle.  An 018 wire was then inserted up to the mark on the wire.  A micropuncture sheath was then inserted.  The wire was removed.  Hand-held injections were performed locating the carotid bifurcation which was marked on the screen.  A carotid Amplatz wire was then inserted up to the line.  The micropuncture sheath was removed and the TCAR sheath was then placed.  It was sutured into position with 2 silk 2-0 silk sutures.  The reverse flow system was then set up and connected.  Passive flow reversal was confirmed.  We then performed multiple  contrast injections to identify the best working length.  We had difficulty opening up the carotid bifurcation.  We ultimately found an adequate working length.  I did elect to go on active flow reversal during this process because we were having to take so many pictures.  Once we settle on the appropriate working view, a 018 wire was inserted.  This was easily navigated into  the internal carotid artery.  I then used a 5 x 30 predilation balloon.  2 separate inflations were required.  Next the ENROUTE 9 x 40 stent was inserted and deployed across the lesion.  This was postdilated with a 7 x 30 balloon.  Completion imaging was then performed.  There was no residual stenosis.  There is a little bit of spasm at the tip of the stent but this area is widely patent.  There is no evidence of dissection from the sheath insertion.  The wire was then removed.  The clamp on the proximal common carotid artery was removed and flow reversal was discontinued.  Blood in the tube was returned to the patient.  I then removed the carotid sheath and secured the arteriotomy with a 5-0 Prolene.  Hand-held Doppler confirmed excellent Doppler signals in the common carotid artery.  50 mg of protamine was administered.  The sheath in the right groin was removed and manual pressure was held for hemostasis.  The carotid incision was irrigated and hemostasis was achieved.  The Ray-Tec behind the carotid was removed.  Once I was satisfied with hemostasis, the platysma muscle was reapproximated with 3-0 Vicryl and skin was closed with 4-0 Vicryl followed by Dermabond.  Patient was successfully extubated and taken to the recovery in stable condition.  He was neurologically intact.   Disposition: To PACU stable.   Theotis Burrow, M.D., [facility] [place] Office: [phone] Pager:  [phone]

## 2019-08-10 NOTE — Interval H&P Note (Signed)
History and Physical Interval Note:  08/10/2019 8:06 AM  Douglas Griffith  has presented today for surgery, with the diagnosis of LEFT CAROTID ARTERY STENOSIS.  The various methods of treatment have been discussed with the patient and family. After consideration of risks, benefits and other options for treatment, the patient has consented to  Procedure(s): TRANSCAROTID ARTERY REVASCULARIZATION LEFT (Left) as a surgical intervention.  The patient's history has been reviewed, patient examined, no change in status, stable for surgery.  I have reviewed the patient's chart and labs.  Questions were answered to the patient's satisfaction.     Durene Cal

## 2019-08-11 ENCOUNTER — Encounter: Payer: Self-pay | Admitting: *Deleted

## 2019-08-11 LAB — BASIC METABOLIC PANEL
Anion gap: 8 (ref 5–15)
BUN: 15 mg/dL (ref 8–23)
CO2: 23 mmol/L (ref 22–32)
Calcium: 9.1 mg/dL (ref 8.9–10.3)
Chloride: 107 mmol/L (ref 98–111)
Creatinine, Ser: 0.76 mg/dL (ref 0.61–1.24)
GFR calc Af Amer: >60 mL/min (ref >60)
GFR calc non Af Amer: >60 mL/min (ref >60)
Glucose, Bld: 147 mg/dL — ABNORMAL HIGH (ref 70–99)
Potassium: 4.1 mmol/L (ref 3.5–5.1)
Sodium: 138 mmol/L (ref 135–145)

## 2019-08-11 LAB — CBC
HCT: 35.8 % — ABNORMAL LOW (ref 39.0–52.0)
Hemoglobin: 12 g/dL — ABNORMAL LOW (ref 13.0–17.0)
MCH: 31.4 pg (ref 26.0–34.0)
MCHC: 33.5 g/dL (ref 30.0–36.0)
MCV: 93.7 fL (ref 80.0–100.0)
Platelets: 291 10*3/uL (ref 150–400)
RBC: 3.82 MIL/uL — ABNORMAL LOW (ref 4.22–5.81)
RDW: 12.4 % (ref 11.5–15.5)
WBC: 8.2 10*3/uL (ref 4.0–10.5)
nRBC: 0 % (ref 0.0–0.2)

## 2019-08-11 MED ORDER — OXYCODONE-ACETAMINOPHEN 5-325 MG PO TABS: 1.0000 | ORAL_TABLET | Freq: Four times a day (QID) | ORAL | 0 refills | Status: DC | PRN

## 2019-08-11 NOTE — Discharge Instructions (Signed)
[facility]  Discharge Instructions   Carotid Endarterectomy (CEA)  Please refer to the following instructions for your post-procedure care. Your surgeon or physician assistant will discuss any changes with you.  Activity  You are encouraged to walk as much as you can. You can slowly return to normal activities but must avoid strenuous activity and heavy lifting until your doctor tell you it's OK. Avoid activities such as vacuuming or swinging a golf club. You can drive after one week if you are comfortable and you are no longer taking prescription pain medications. It is normal to feel tired for serval weeks after your surgery. It is also normal to have difficulty with sleep habits, eating, and bowel movements after surgery. These will go away with time.  Bathing/Showering  You may shower after you come home. Do not soak in a bathtub, hot tub, or swim until the incision heals completely.  Incision Care  Shower every day. Clean your incision with mild soap and water. Pat the area dry with a clean towel. You do not need a bandage unless otherwise instructed. Do not apply any ointments or creams to your incision. You may have skin glue on your incision. Do not peel it off. It will come off on its own in about one week. Your incision may feel thickened and raised for several weeks after your surgery. This is normal and the skin will soften over time. For Men Only: It's OK to shave around the incision but do not shave the incision itself for 2 weeks. It is common to have numbness under your chin that could last for several months.  Diet  Resume your normal diet. There are no special food restrictions following this procedure. A low fat/low cholesterol diet is recommended for all patients with vascular disease. In order to heal from your surgery, it is CRITICAL to get adequate nutrition. Your body requires vitamins, minerals, and protein. Vegetables are the best  source of vitamins and minerals. Vegetables also provide the perfect balance of protein. Processed food has little nutritional value, so try to avoid this.        Medications  Resume taking all of your medications unless your doctor or physician assistant tells you not to. If your incision is causing pain, you may take over-the- counter pain relievers such as acetaminophen (Tylenol). If you were prescribed a stronger pain medication, please be aware these medications can cause nausea and constipation. Prevent nausea by taking the medication with a snack or meal. Avoid constipation by drinking plenty of fluids and eating foods with a high amount of fiber, such as fruits, vegetables, and grains. Do not take Tylenol if you are taking prescription pain medications.  Restart your Xarelto 20 mg tomorrow 08/12/19  Follow Up  Our office will schedule a follow up appointment 2-3 weeks following discharge.  Please call us immediately for any of the following conditions  Increased pain, redness, drainage (pus) from your incision site. Fever of 101 degrees or higher. If you should develop stroke (slurred speech, difficulty swallowing, weakness on one side of your body, loss of vision) you should call 911 and go to the nearest emergency room.  Reduce your risk of vascular disease:  Stop smoking. If you would like help call [facility] at 1-800-QUIT-NOW (4140538715) or  at [phone]. Manage your cholesterol Maintain a desired weight Control your diabetes Keep your blood pressure down  If you have any questions, please call the office at [phone].

## 2019-08-11 NOTE — Plan of Care (Signed)
  Problem: Education: Goal: Knowledge of discharge needs will improve Outcome: Completed/Met   Problem: Clinical Measurements: Goal: Postoperative complications will be avoided or minimized Outcome: Completed/Met   Problem: Respiratory: Goal: Ability to achieve and maintain a regular respiratory rate will improve Outcome: Completed/Met   Problem: Skin Integrity: Goal: Demonstration of wound healing without infection will improve Outcome: Completed/Met   Problem: Education: Goal: Knowledge of General Education information will improve Description: Including pain rating scale, medication(s)/side effects and non-pharmacologic comfort measures Outcome: Completed/Met   Problem: Health Behavior/Discharge Planning: Goal: Ability to manage health-related needs will improve Outcome: Completed/Met   Problem: Clinical Measurements: Goal: Ability to maintain clinical measurements within normal limits will improve Outcome: Completed/Met Goal: Will remain free from infection Outcome: Completed/Met Goal: Diagnostic test results will improve Outcome: Completed/Met Goal: Respiratory complications will improve Outcome: Completed/Met Goal: Cardiovascular complication will be avoided Outcome: Completed/Met   Problem: Activity: Goal: Risk for activity intolerance will decrease Outcome: Completed/Met   Problem: Nutrition: Goal: Adequate nutrition will be maintained Outcome: Completed/Met   Problem: Coping: Goal: Level of anxiety will decrease Outcome: Completed/Met   Problem: Elimination: Goal: Will not experience complications related to bowel motility Outcome: Completed/Met Goal: Will not experience complications related to urinary retention Outcome: Completed/Met   Problem: Pain Managment: Goal: General experience of comfort will improve Outcome: Completed/Met   Problem: Safety: Goal: Ability to remain free from injury will improve Outcome: Completed/Met   Problem: Skin  Integrity: Goal: Risk for impaired skin integrity will decrease Outcome: Completed/Met

## 2019-08-11 NOTE — Progress Notes (Addendum)
  Progress Note    08/11/2019 7:42 AM 1 Day Post-Op  Subjective:  POD#1 TCAR.  Had some bleeding from superficial skin tear below neck incision. Patient is doing well with no complaints. Patient has ambulated in room. No trouble swallowing or speaking. No headaches, dizziness, confusion, weakness of upper or lower extremities.  Vitals:   08/11/19 0028 08/11/19 0549  BP: 123/67 135/81  Pulse: 70 68  Resp: 18 20  Temp: 97.8 F (36.6 C) 97.8 F (36.6 C)  SpO2: 96% 97%   Physical Exam: Cardiac:  Heart regular rate and rhythm Lungs:  Non labored Incisions: left neck incision clean, dry, intact. Very minimal swelling, no tenderness, no hematoma. Just below incision small superficial skin tear, no bleeding, no tenderness Extremities:  Right femoral acces site, clean and dry. No tenderness. No swelling, ecchymosis or hematoma present. Right femoral pulse 3+. Abdomen: nondistended Neurologic: No focal weakness, normal strength  CBC    Component Value Date/Time   WBC 8.2 08/11/2019 0500   RBC 3.82 (L) 08/11/2019 0500   HGB 12.0 (L) 08/11/2019 0500   HCT 35.8 (L) 08/11/2019 0500   PLT 291 08/11/2019 0500   MCV 93.7 08/11/2019 0500   MCH 31.4 08/11/2019 0500   MCHC 33.5 08/11/2019 0500   RDW 12.4 08/11/2019 0500    BMET    Component Value Date/Time   NA 138 08/11/2019 0500   K 4.1 08/11/2019 0500   CL 107 08/11/2019 0500   CO2 23 08/11/2019 0500   GLUCOSE 147 (H) 08/11/2019 0500   BUN 15 08/11/2019 0500   CREATININE 0.76 08/11/2019 0500   CALCIUM 9.1 08/11/2019 0500   GFRNONAA >60 08/11/2019 0500   GFRAA >60 08/11/2019 0500    INR    Component Value Date/Time   INR 1.2 08/10/2019 0742     Intake/Output Summary (Last 24 hours) at 08/11/2019 0742 Last data filed at 08/11/2019 0400 Gross per 24 hour  Intake 1160 ml  Output 2075 ml  Net -915 ml     Assessment/Plan:  74 y.o. male is s/p Left transcarotid artery revascularization with stent placement 1 Day Post-Op.  Patient is doing well. He will go home today on Aspirin and Plavix. He will restart his Xarelto tomorrow. He will follow up in the office in 4 weeks with Duplex with Dr. Myra Gianotti  DVT prophylaxis:  Heparin   Graceann Congress, PA-C [facility] [phone] 08/11/2019 7:42 AM   I have seen and evaluated the patient. I agree with the PA note as documented above. POD#1 s/p left TCAR.  Neuro intact.  No events overnight.  Plan for d/c today.  Continue aspirin and plavix.  Resume Xarelto tomorrow.  F/U 4 weeks with Dr. Myra Gianotti.  Cephus Shelling, MD [facility]: [phone]

## 2019-08-11 NOTE — Progress Notes (Signed)
Discharge instructions given to Mr. Douglas Griffith.  Discussed new medications, medication changes and side effects.  Discussed activities and lifting restrictions until follow up.  Discussed signs and symptoms to watch for and when to contact the physician.  Discussed follow up appointments.  Verbalized understanding.

## 2019-08-11 NOTE — Discharge Summary (Addendum)
Carotid Discharge Summary     Douglas Griffith 1945-08-13 74 y.o. male  [id number]  Admission Date: 08/10/2019  Discharge Date: 08/11/2019  Physician: Dr. Coral Else  Admission Diagnosis: Amaurosis Fugax Left Internal Carotid Artery Stenosis   Hospital Course:  The patient was admitted to the hospital for symptomatic high grade- 90% left carotid stenosis with Amaurosis Fugax and was taken to the operating room on 08/10/2019 and underwent Left transcarotid revascularization with stent placement (TCAR).  The pt tolerated the procedure well and was transported to the PACU in good condition.  By POD 1, the pt neuro status was stable. No new neurological events. No vision changes, slurred speech, difficulty swallowing, or weakness of upper or lower extremities  The remainder of the hospital course consisted of increasing mobilization and increasing intake of solids without difficulty.  He will be discharged home with follow up with Dr. 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 Hypercholesteremia   . Asymptomatic bilateral carotid artery stenosis   . Essential hypertension   . Paroxysmal atrial fibrillation (HCC) 08/16/2012   Past Medical History:  Diagnosis Date  . Asymptomatic bilateral carotid artery stenosis   . Atrial flutter (HCC)   . Dysrhythmia    PAF  . Essential hypertension   . GERD (gastroesophageal reflux disease)   .  Hypercholesteremia   . Paroxysmal atrial fibrillation (HCC)     Allergies as of 08/11/2019   No Known Allergies     Medication List    TAKE these medications   aspirin EC 81 MG tablet Take 81 mg by mouth daily.   clopidogrel 75 MG tablet Commonly known as: PLAVIX Take 1 tablet (75 mg total) by mouth daily.   Dilt-XR 180 MG 24 hr capsule Generic drug: diltiazem Take 1 capsule (180 mg total) by mouth 2 (two) times a day.   ezetimibe-simvastatin 10-40 MG tablet Commonly known as: VYTORIN Take 1 tablet by mouth daily with lunch.   famotidine 10 MG chewable tablet Commonly known as: PEPCID AC Chew 10 mg by mouth at bedtime. Repeat if needed.  Takes if needed when he takes several medications at a time.   flecainide 50 MG tablet Commonly known as: TAMBOCOR Take 50 mg by mouth 2 (two) times daily. What changed: Another medication with the same name was changed. Make sure you understand how and when to take each.   flecainide 100 MG tablet Commonly known as: TAMBOCOR Take 1 tablet (100 mg total) by mouth 2 (two) times daily. What changed: how much to take   losartan 25 MG tablet Commonly known as: COZAAR TAKE 2 TABLETS BY MOUTH EVERY DAY   oxyCODONE-acetaminophen 5-325 MG tablet Commonly known as: Percocet Take 1 tablet by mouth every 6 (six) hours as needed for severe pain.   Xarelto 20  MG Tabs tablet Generic drug: rivaroxaban TAKE 1 TABLET (20 MG TOTAL) BY MOUTH DAILY WITH SUPPER. What changed: See the new instructions.        Discharge Instructions:   [facility] Discharge Instructions Carotid Endarterectomy (CEA)  Please refer to the following instructions for your post-procedure care. Your surgeon or physician assistant will discuss any changes with you.  Activity  You are encouraged to walk as much as you can. You can slowly return to normal activities but must avoid strenuous activity and heavy lifting until your doctor  tell you it's OK. Avoid activities such as vacuuming or swinging a golf club. You can drive after one week if you are comfortable and you are no longer taking prescription pain medications. It is normal to feel tired for serval weeks after your surgery. It is also normal to have difficulty with sleep habits, eating, and bowel movements after surgery. These will go away with time.  Bathing/Showering  You may shower after you come home. Do not soak in a bathtub, hot tub, or swim until the incision heals completely.  Incision Care  Shower every day. Clean your incision with mild soap and water. Pat the area dry with a clean towel. You do not need a bandage unless otherwise instructed. Do not apply any ointments or creams to your incision. You may have skin glue on your incision. Do not peel it off. It will come off on its own in about one week. Your incision may feel thickened and raised for several weeks after your surgery. This is normal and the skin will soften over time. For Men Only: It's OK to shave around the incision but do not shave the incision itself for 2 weeks. It is common to have numbness under your chin that could last for several months.  Diet  Resume your normal diet. There are no special food restrictions following this procedure. A low fat/low cholesterol diet is recommended for all patients with vascular disease. In order to heal from your surgery, it is CRITICAL to get adequate nutrition. Your body requires vitamins, minerals, and protein. Vegetables are the best source of vitamins and minerals. Vegetables also provide the perfect balance of protein. Processed food has little nutritional value, so try to avoid this.  Medications  Resume taking all of your medications unless your doctor or physician assistant tells you not to.  If your incision is causing pain, you may take over-the- counter pain relievers such as acetaminophen (Tylenol). If you were prescribed a stronger pain  medication, please be aware these medications can cause nausea and constipation.  Prevent nausea by taking the medication with a snack or meal. Avoid constipation by drinking plenty of fluids and eating foods with a high amount of fiber, such as fruits, vegetables, and grains. Do not take Tylenol if you are taking prescription pain medications.  Follow Up  Our office will schedule a follow up appointment 2-3 weeks following discharge.  Please call us immediately for any of the following conditions  . Increased pain, redness, drainage (pus) from your incision site. . Fever of 101 degrees or higher. . If you should develop stroke (slurred speech, difficulty swallowing, weakness on one side of your body, loss of vision) you should call 911 and go to the nearest emergency room. .  Reduce your risk of vascular disease:  . Stop smoking. If you would like help call [facility] at 1-800-QUIT-NOW (657)330-8654) or [place] at (775)879-8778. . Manage your  cholesterol . Maintain a desired weight . Control your diabetes . Keep your blood pressure down .  If you have any questions, please call the office at [phone].  Prescriptions given: Oxycodone- Acetaminophen 5-325 #8 No Refill  Disposition: Home  Patient's condition: is Excellent  Follow up: 1. Dr. Myra Gianotti in 4 weeks.   Corrina Baglia PA-C [facility] [phone]   --- For [facility] Registry use ---   Modified Rankin score at D/C (0-6): 0  IV medication needed for:  1. Hypertension: No 2. Hypotension: No  Post-op Complications: No  1. Post-op CVA or TIA: No  If yes: Event classification (right eye, left eye, right cortical, left cortical, verterobasilar, other): n/a  If yes: Timing of event (intra-op, <6 hrs post-op, >=6 hrs post-op, unknown): n/a  2. CN injury: No  If yes: CN n/a injuried   3. Myocardial infarction: No  If yes: Dx by (EKG or clinical, Troponin): n/a   4.  CHF: No  5.   Dysrhythmia (new): No  6. Wound infection: No  7. Reperfusion symptoms: No  8. Return to OR: No  If yes: return to OR for (bleeding, neurologic, other CEA incision, other): n/a  Discharge medications: Statin use:  Yes ASA use:  Yes   Beta blocker use:  No ACE-Inhibitor use:  No  ARB use:  Yes CCB use: Yes P2Y12 Antagonist use: Yes, [x ] Plavix, [ ]  Plasugrel, [ ]  Ticlopinine, [ ]  Ticagrelor, [ ]  Other, [ ]  No for medical reason, [ ]  Non-compliant, [ ]  Not-indicated Anti-coagulant use:  Yes, [ ]  Warfarin, [x ] Rivaroxaban, [ ]  Dabigatran,

## 2019-08-31 ENCOUNTER — Other Ambulatory Visit: Payer: Self-pay

## 2019-08-31 ENCOUNTER — Ambulatory Visit: Payer: Medicare Other | Admitting: Cardiology

## 2019-08-31 ENCOUNTER — Encounter: Payer: Self-pay | Admitting: Cardiology

## 2019-08-31 VITALS — BP 152/89 | HR 76 | Temp 98.1°F | Ht 72.0 in | Wt 185.9 lb

## 2019-08-31 DIAGNOSIS — I10 Essential (primary) hypertension: Secondary | ICD-10-CM | POA: Diagnosis not present

## 2019-08-31 DIAGNOSIS — E7800 Pure hypercholesterolemia, unspecified: Secondary | ICD-10-CM | POA: Diagnosis not present

## 2019-08-31 DIAGNOSIS — I480 Paroxysmal atrial fibrillation: Secondary | ICD-10-CM

## 2019-08-31 DIAGNOSIS — I6523 Occlusion and stenosis of bilateral carotid arteries: Secondary | ICD-10-CM | POA: Diagnosis not present

## 2019-08-31 MED ORDER — VALSARTAN-HYDROCHLOROTHIAZIDE 160-12.5 MG PO TABS: 1.0000 | ORAL_TABLET | Freq: Every day | ORAL | 3 refills | Status: DC

## 2019-08-31 MED ORDER — ROSUVASTATIN CALCIUM 20 MG PO TABS: 20.0000 mg | ORAL_TABLET | Freq: Every day | ORAL | 2 refills | Status: DC

## 2019-08-31 MED ORDER — EZETIMIBE 10 MG PO TABS: 10.0000 mg | ORAL_TABLET | Freq: Every day | ORAL | 2 refills | Status: DC

## 2019-08-31 NOTE — Progress Notes (Signed)
Primary Physician/Referring:  Jani Gravel, MD  Patient ID: Douglas Griffith, male    DOB: [birth year], 74 y.o.   MRN: [id number]  Chief Complaint  Patient presents with  . carotid stenosis  . Hypertension  . Atrial Fibrillation  . Follow-up    6 week    Douglas Griffith  is a 74 y.o. male   Douglas Griffith  is a 74 y.o. male   Caucasian male with history of hypertension, hyperlipidemia, asymptomatic carotid stenosis and  paroxysmal atrial fibrillation, underwent trans-carotid artery revascularization with flow reversal by Dr. Annamarie Major on 08/10/2019.  He has not had any further episodes of visual disturbances in the left eye.  Past medical history significant for hypertension, hyperlipidemia, paroxysmal atrial fibrillation and strong family still premature coronary artery disease in father who had CAD in his 46s. He has noticed fatigue over the past couple weeks and mild dyspnea.  Past Medical History:  Diagnosis Date  . Asymptomatic bilateral carotid artery stenosis   . Atrial flutter ([place])   . Dysrhythmia    PAF  . Essential hypertension   . GERD (gastroesophageal reflux disease)   . Hypercholesteremia   . Paroxysmal atrial fibrillation [facility])     Past Surgical History:  Procedure Laterality Date  . APPENDECTOMY  1960  . EYE SURGERY Bilateral    cataracts  . TRANSCAROTID ARTERY REVASCULARIZATION Left 08/10/2019   Procedure: TRANSCAROTID ARTERY REVASCULARIZATION LEFT USING ENROUTE TRANSCAROTID STENT;  Surgeon: Serafina Mitchell, MD;  Location: [facility] OR;  Service: Vascular;  Laterality: Left;   Social History   Tobacco Use  . Smoking status: Former Smoker    Years: 1.00    Types: Cigarettes    Quit date: 07/19/1959    Years since quitting: 60.1  . Smokeless tobacco: Never Used  . Tobacco comment: smoked as a teen  Substance Use Topics  . Alcohol use: Yes    Alcohol/week: 7.0 standard drinks    Types: 7 Glasses of wine per week    Comment: 1-1.5 glasses of wine per  day    Family History  Problem Relation Age of Onset  . CAD Father        died of MI at age 38  . CAD Brother     Review of Systems  Constitution: Positive for malaise/fatigue.  Cardiovascular: Positive for dyspnea on exertion. Negative for chest pain and leg swelling.  Gastrointestinal: Negative for melena.   Objective  Blood pressure (!) 152/89, pulse 76, temperature 98.1 F (36.7 C), height 6' (1.829 m), weight 185 lb 14.4 oz (84.3 kg), SpO2 98 %. Body mass index is 25.21 kg/m.   Vitals with BMI 08/31/2019 08/11/2019 08/11/2019  Height 6' 0"  - -  Weight 185 lbs 14 oz - -  BMI 75.91 - -  Systolic 638 466 599  Diastolic 89 99 81  Pulse 76 - 68   Physical Exam  Constitutional: He appears well-developed and well-nourished. No distress.  HENT:  Head: Atraumatic.  Eyes: Conjunctivae are normal.  Neck: No JVD present. No thyromegaly present.  Cardiovascular: Normal rate, normal heart sounds, intact distal pulses and normal pulses. An irregularly irregular rhythm present. Exam reveals no gallop.  No murmur heard. Pulses:      Carotid pulses are on the right side with bruit. No edema. No JVD. Vascular exam except for right carotid bruit is normal.   Pulmonary/Chest: Effort normal and breath sounds normal.  Abdominal: Soft. Bowel sounds are normal.  Musculoskeletal:  Cervical back: Neck supple.   Laboratory examination:   CMP Latest Ref Rng & Units 08/11/2019 08/10/2019 07/25/2019  Glucose 70 - 99 mg/dL 147(H) 98 106(H)  BUN 8 - 23 mg/dL 15 21 17   Creatinine 0.61 - 1.24 mg/dL 0.76 0.98 0.96  Sodium 135 - 145 mmol/L 138 140 139  Potassium 3.5 - 5.1 mmol/L 4.1 3.9 4.0  Chloride 98 - 111 mmol/L 107 103 103  CO2 22 - 32 mmol/L 23 23 26   Calcium 8.9 - 10.3 mg/dL 9.1 9.1 9.5  Total Protein 6.5 - 8.1 g/dL - 6.9 7.4  Total Bilirubin 0.3 - 1.2 mg/dL - 1.0 0.7  Alkaline Phos 38 - 126 U/L - 46 54  AST 15 - 41 U/L - 23 28  ALT 0 - 44 U/L - 22 28   CBC Latest Ref Rng & Units 08/11/2019  08/10/2019 07/25/2019  WBC 4.0 - 10.5 K/uL 8.2 5.8 6.6  Hemoglobin 13.0 - 17.0 g/dL 12.0(L) 12.7(L) 14.1  Hematocrit 39.0 - 52.0 % 35.8(L) 39.9 43.8  Platelets 150 - 400 K/uL 291 296 330   External labs 05/12/2019:  Total cholesterol 143, triglycerides 48, HDL 65, LDL 91. A1c 5.3%.  05/17/2018: WBC 4.3, CBC otherwise normal.  Creatinine 0.87, EGFR 85, potassium 4.4, CMP normal.  Cholesterol 170, triglycerides 60, HDL 70, LDL 88.  Hemoglobin A1c 5.3%.  Vitamin D low at 28.9.  TSH normal.  Medications   Current Outpatient Medications  Medication Instructions  . aspirin EC 81 mg, Oral, Daily  . clopidogrel (PLAVIX) 75 mg, Oral, Daily  . Dilt-XR 180 mg, Oral, 2 times daily  . ezetimibe (ZETIA) 10 mg, Oral, Daily  . famotidine (PEPCID AC) 10 mg, Oral, Daily at bedtime, Repeat if needed.Takes if needed when he takes several medications at a time.  . flecainide (TAMBOCOR) 50 mg, Oral, 2 times daily  . rosuvastatin (CRESTOR) 20 mg, Oral, Daily  . valsartan-hydrochlorothiazide (DIOVAN HCT) 160-12.5 MG tablet 1 tablet, Oral, Daily  . XARELTO 20 MG TABS tablet TAKE 1 TABLET (20 MG TOTAL) BY MOUTH DAILY WITH SUPPER.   Radiology: CTA neck 08/01/2019: 1. Critical, flow-limiting stenosis proximal left internal carotid artery due to atherosclerotic disease. Estimated 90% diameter stenosis proximal left internal carotid artery 2. Atherosclerotic disease right carotid bifurcation with less than 25% diameter stenosis right internal carotid artery. 3. Moderate stenosis origin of left vertebral artery. Right vertebral artery widely patent without stenosis.  Cardiac Studies:   Echo 06/12/11: Normal LVEF. Mild LVH. Trace AI.  Exercise sestamibi 2012: Exercise duration 12 min, 15 METs. Diaphragmatic attenuation. No ischemia. EF 64%. low risk.  Event Monitor 06/16/12 through 07/15/2012: Paroxysmal episodes of A. Fibrillation and A. Flutter, asymptomatic. Mostly rate controlled.  Carotid artery duplex   06/23/2019: Stenosis in the right internal carotid artery (16-49%). Critical stenosis in the left internal carotid artery (near occlusion). Reduced Doppler flow signal without velocity elevation.  Stenosis severity may be an error due to heterogeneous plaque and calcification. Stenosis in the left external carotid artery (<50%). Antegrade right vertebral artery flow. Antegrade left vertebral artery flow. Compared to the study done on 01/06/2019, there is progression of left ICA stenosis. Follow up in four months is appropriate if clinically indicated. Consider angiography (CT vs invasive).   08/10/2019: Left transcarotid artery revascularization with stent placement (TCAR)     EKG 08/31/2019: Course atrial fibrillation with controlled ventricular response at the rate of 93 bpm, diffuse nonspecific ST-T abnormality.  Cannot exclude inferior and lateral ischemia.  EKG 07/19/2019: Sinus bradycardia at rate of 56 bpm, LAE,  normal axis.  Poor R-wave progression, probably normal variant.  No evidence of ischemia  Assessment     ICD-10-CM   1. Paroxysmal atrial fibrillation (HCC) HA2DS2-VASc Score is 5. Yearly risk of stroke: 6.7% (A, HTN, Vasc dz, TIA)   I48.0 EKG 12-Lead  2. Essential hypertension  I10 valsartan-hydrochlorothiazide (DIOVAN HCT) 160-12.5 MG tablet    CMP14+EGFR    CMP14+EGFR  3. Bilateral carotid artery stenosis  I65.23   4. Hypercholesteremia  E78.00 rosuvastatin (CRESTOR) 20 MG tablet    ezetimibe (ZETIA) 10 MG tablet    Lipid Panel With LDL/HDL Ratio    Lipid Panel With LDL/HDL Ratio   Vytrorin Change to Crestor and Zetia due to efficacy  Recommendations:   Douglas Griffith  is a 74 y.o. male   Douglas Griffith  is a 74 y.o. male   Caucasian male with history of hypertension, hyperlipidemia, asymptomatic carotid stenosis and  paroxysmal atrial fibrillation, underwent trans-carotid artery revascularization with flow reversal by Dr. Annamarie Major on 08/10/2019.  He has  not had any further episodes of visual disturbances in the left eye.  Past medical history significant for hypertension, hyperlipidemia, paroxysmal atrial fibrillation and strong family still premature coronary artery disease in father who had CAD in his 49s. No further bruit in his left carotid.  His blood pressure is elevated, previously left alone for permissive hypertension.  Will discontinue losartan and switch him to valsartan HCT 160/12.5 mg in the morning.  Reviewed his labs, non-HDL cholesterol is at goal however LDL is still not.  In view of vascular disease, discontinue Vytorin 10/40 which had been effective and that reduced his total cholesterol from 300+ previously.  Start Crestor 20 along with Zetia 10 mg daily, check his lipids in 4 to 5 weeks and see him back in 6 weeks.  This is to follow-up on labs, blood pressure and hyperlipidemia.  He is fatigue and dyspnea is probably related to recurrence of atrial fibrillation.  He has had paroxysmal atrial fibrillation, we will revisit if he is still in persistent atrial fibrillation on his next office visit, he will either need ablation consideration or cardioversion.  He is aware he will needon triple therapy with ASA, Plavix and Xarelto and he is seeing Dr. Trula Slade next week.  This was a 40-minute office visit encounter.  Adrian Prows, MD, [facility] 08/31/2019, 6:38 PM [facility]. [facility]  CC: Annamarie Major, MD; Jani Gravel, MD

## 2019-09-05 ENCOUNTER — Ambulatory Visit (INDEPENDENT_AMBULATORY_CARE_PROVIDER_SITE_OTHER): Payer: Self-pay | Admitting: Surgery

## 2019-09-05 ENCOUNTER — Encounter: Payer: Self-pay | Admitting: Surgery

## 2019-09-05 ENCOUNTER — Other Ambulatory Visit: Payer: Self-pay

## 2019-09-05 ENCOUNTER — Ambulatory Visit (HOSPITAL_COMMUNITY)
Admission: RE | Admit: 2019-09-05 | Discharge: 2019-09-05 | Disposition: A | Discharge status: Home / Self Care | Payer: Medicare Other | Source: Ambulatory Visit | Attending: Surgery | Admitting: Surgery

## 2019-09-05 VITALS — BP 108/61 | HR 56 | Temp 98.0°F | Resp 20 | Ht 72.0 in | Wt 186.0 lb

## 2019-09-05 DIAGNOSIS — I6523 Occlusion and stenosis of bilateral carotid arteries: Secondary | ICD-10-CM | POA: Insufficient documentation

## 2019-09-05 DIAGNOSIS — I6522 Occlusion and stenosis of left carotid artery: Secondary | ICD-10-CM

## 2019-09-05 NOTE — Progress Notes (Signed)
Patient name: Douglas Griffith MRN: [id number] DOB: [birth year] Sex: male  REASON FOR VISIT:     post op  HISTORY OF PRESENT ILLNESS:   Douglas Griffith is a 74 y.o. male, who is referred for evaluation of left carotid stenosis.  The patient's carotid disease was initially detected by hearing a bruit on physical exam.  He has been followed with serial ultrasounds.  Most recently his ultrasound progressed to greater than 80% and he went for a CT angiogram which showed a 90% left carotid lesion.  With regards to his symptomatology.  The patient states that for the past 3 months he has had 3 or 4 episodes where his vision got cloudy.  This lasted for about 10 to 15 seconds and resolved.  He has not had any other upper or lower extremity symptoms or slurred speech.  The patient underwent left TCAR for what turned out to be a 80% carotid stenosis.  His surgery was delayed 2 weeks because of a positive test for Covid even though he was asymptomatic.   The patient is a former smoker.  He does have a family history of premature cardiovascular disease with his dad having heart issues in his 19s.  He is on Xarelto for atrial fibrillation.  He is medically managed for hypertension.  He takes a statin for hypercholesterolemia.   CURRENT MEDICATIONS:    Current Outpatient Medications  Medication Sig Dispense Refill  . aspirin EC 81 MG tablet Take 81 mg by mouth daily.    . clopidogrel (PLAVIX) 75 MG tablet Take 1 tablet (75 mg total) by mouth daily. 30 tablet 6  . DILT-XR 180 MG 24 hr capsule Take 1 capsule (180 mg total) by mouth 2 (two) times a day. 180 capsule 3  . ezetimibe (ZETIA) 10 MG tablet Take 1 tablet (10 mg total) by mouth daily. 30 tablet 2  . famotidine (PEPCID AC) 10 MG chewable tablet Chew 10 mg by mouth at bedtime. Repeat if needed.  Takes if needed when he takes several medications at a time.    . flecainide (TAMBOCOR) 50 MG tablet Take 50 mg by  mouth 2 (two) times daily.    . rosuvastatin (CRESTOR) 20 MG tablet Take 1 tablet (20 mg total) by mouth daily. 30 tablet 2  . valsartan-hydrochlorothiazide (DIOVAN HCT) 160-12.5 MG tablet Take 1 tablet by mouth daily. 90 tablet 3  . XARELTO 20 MG TABS tablet TAKE 1 TABLET (20 MG TOTAL) BY MOUTH DAILY WITH SUPPER. (Patient taking differently: Take 20 mg by mouth daily with lunch. ) 90 tablet 1   No current facility-administered medications for this visit.    REVIEW OF SYSTEMS:   [X]  denotes positive finding, [ ]  denotes negative finding Cardiac  Comments:  Chest pain or chest pressure:    Shortness of breath upon exertion:    Short of breath when lying flat:    Irregular heart rhythm:    Constitutional    Fever or chills:      PHYSICAL EXAM:   Vitals:   09/05/19 1459 09/05/19 1502  BP: 118/71 108/61  Pulse: (!) 56   Resp: 20   Temp: 98 F (36.7 C)   SpO2: 99%   Weight: 186 lb (84.4 kg)   Height: 6' (1.829 m)     GENERAL: The patient is a well-nourished male, in no acute distress. The vital signs are documented above. CARDIOVASCULAR: There is a regular rate and rhythm. PULMONARY: Non-labored respirations Carotid incision is  well-healed  STUDIES:   Right Carotid: Velocities in the right ICA are consistent with a 1-39%  stenosis.         Non-hemodynamically significant plaque <50% noted in the  CCA.   Left Carotid: There is no evidence of stenosis in the left ICA.  Hemodynamically    MEDICAL ISSUES:   Status post left TCAR for symptomatic stenosis.  The patient has not had any other episodes of amaurosis.  Ultrasound today shows the stent to be widely patent.  He is roughly 1 month out from his procedure.  He is already on aspirin and Xarelto in addition to the Plavix that I started for his carotid stent.  He has had several nosebleeds.  I told him to stop his Plavix today.  I have him scheduled for follow-up with me in 9 months with repeat carotid  duplex.  Charlena Cross, MD, FACS [facility] [phone] Pager [phone]

## 2019-09-06 ENCOUNTER — Other Ambulatory Visit: Payer: Self-pay | Admitting: *Deleted

## 2019-09-06 DIAGNOSIS — I6523 Occlusion and stenosis of bilateral carotid arteries: Secondary | ICD-10-CM

## 2019-09-23 ENCOUNTER — Other Ambulatory Visit: Payer: Self-pay | Admitting: Cardiology

## 2019-09-23 DIAGNOSIS — I10 Essential (primary) hypertension: Secondary | ICD-10-CM

## 2019-11-06 ENCOUNTER — Other Ambulatory Visit: Payer: Self-pay | Admitting: Cardiology

## 2019-11-08 ENCOUNTER — Other Ambulatory Visit: Payer: Self-pay | Admitting: Cardiology

## 2019-11-14 DIAGNOSIS — E559 Vitamin D deficiency, unspecified: Secondary | ICD-10-CM | POA: Diagnosis not present

## 2019-11-14 DIAGNOSIS — I10 Essential (primary) hypertension: Secondary | ICD-10-CM | POA: Diagnosis not present

## 2019-11-14 DIAGNOSIS — Z0000 Encounter for general adult medical examination without abnormal findings: Secondary | ICD-10-CM | POA: Diagnosis not present

## 2019-12-01 ENCOUNTER — Other Ambulatory Visit: Payer: Self-pay

## 2019-12-01 DIAGNOSIS — E7800 Pure hypercholesterolemia, unspecified: Secondary | ICD-10-CM

## 2019-12-01 MED ORDER — EZETIMIBE 10 MG PO TABS: 10.0000 mg | ORAL_TABLET | Freq: Every day | ORAL | 2 refills | Status: DC

## 2019-12-01 MED ORDER — ROSUVASTATIN CALCIUM 20 MG PO TABS: 20.0000 mg | ORAL_TABLET | Freq: Every day | ORAL | 2 refills | Status: DC

## 2019-12-02 ENCOUNTER — Other Ambulatory Visit: Payer: Self-pay | Admitting: Cardiology

## 2019-12-13 DIAGNOSIS — R739 Hyperglycemia, unspecified: Secondary | ICD-10-CM | POA: Diagnosis not present

## 2019-12-13 DIAGNOSIS — I10 Essential (primary) hypertension: Secondary | ICD-10-CM | POA: Diagnosis not present

## 2019-12-13 DIAGNOSIS — Z0000 Encounter for general adult medical examination without abnormal findings: Secondary | ICD-10-CM | POA: Diagnosis not present

## 2019-12-13 DIAGNOSIS — Z125 Encounter for screening for malignant neoplasm of prostate: Secondary | ICD-10-CM | POA: Diagnosis not present

## 2020-02-03 DIAGNOSIS — K219 Gastro-esophageal reflux disease without esophagitis: Secondary | ICD-10-CM | POA: Diagnosis not present

## 2020-02-03 DIAGNOSIS — D649 Anemia, unspecified: Secondary | ICD-10-CM | POA: Diagnosis not present

## 2020-02-16 DIAGNOSIS — D649 Anemia, unspecified: Secondary | ICD-10-CM | POA: Diagnosis not present

## 2020-02-16 DIAGNOSIS — K219 Gastro-esophageal reflux disease without esophagitis: Secondary | ICD-10-CM | POA: Diagnosis not present

## 2020-02-16 DIAGNOSIS — R1013 Epigastric pain: Secondary | ICD-10-CM | POA: Diagnosis not present

## 2020-02-16 DIAGNOSIS — Z7901 Long term (current) use of anticoagulants: Secondary | ICD-10-CM | POA: Diagnosis not present

## 2020-02-17 DIAGNOSIS — R1013 Epigastric pain: Secondary | ICD-10-CM | POA: Diagnosis not present

## 2020-02-17 DIAGNOSIS — K2930 Chronic superficial gastritis without bleeding: Secondary | ICD-10-CM | POA: Diagnosis not present

## 2020-02-17 DIAGNOSIS — K3189 Other diseases of stomach and duodenum: Secondary | ICD-10-CM | POA: Diagnosis not present

## 2020-02-25 ENCOUNTER — Other Ambulatory Visit: Payer: Self-pay | Admitting: Cardiology

## 2020-02-25 DIAGNOSIS — E7800 Pure hypercholesterolemia, unspecified: Secondary | ICD-10-CM

## 2020-02-29 ENCOUNTER — Encounter: Payer: Self-pay | Admitting: Cardiology

## 2020-03-05 ENCOUNTER — Ambulatory Visit: Payer: Medicare Other | Admitting: Cardiology

## 2020-03-05 ENCOUNTER — Other Ambulatory Visit: Payer: Self-pay

## 2020-03-05 ENCOUNTER — Encounter: Payer: Self-pay | Admitting: Cardiology

## 2020-03-05 VITALS — BP 122/50 | HR 62 | Resp 16 | Ht 72.0 in | Wt 184.0 lb

## 2020-03-05 DIAGNOSIS — Z9889 Other specified postprocedural states: Secondary | ICD-10-CM | POA: Diagnosis not present

## 2020-03-05 DIAGNOSIS — I10 Essential (primary) hypertension: Secondary | ICD-10-CM | POA: Diagnosis not present

## 2020-03-05 DIAGNOSIS — E7800 Pure hypercholesterolemia, unspecified: Secondary | ICD-10-CM

## 2020-03-05 DIAGNOSIS — I6523 Occlusion and stenosis of bilateral carotid arteries: Secondary | ICD-10-CM

## 2020-03-05 DIAGNOSIS — I480 Paroxysmal atrial fibrillation: Secondary | ICD-10-CM | POA: Diagnosis not present

## 2020-03-05 NOTE — Progress Notes (Signed)
Primary Physician/Referring:  Jani Gravel, MD  Patient ID: Douglas Griffith, male    DOB: 05/05/1946, 74 y.o.   MRN: [id number]  Chief Complaint  Patient presents with  . Atrial Fibrillation  . Hypertension  . Carotid    6 month   Douglas Griffith  is a 74 y.o. male   Douglas Griffith  is a 74 y.o. male   Caucasian male with history of hypertension, hyperlipidemia, asymptomatic carotid stenosis and  paroxysmal atrial fibrillation, underwent trans-carotid artery revascularization with flow reversal by Dr. Annamarie Major on 08/10/2019.  He has not had any further episodes of visual disturbances in the left eye.  Past medical history significant for hypertension, hyperlipidemia, paroxysmal atrial fibrillation and strong family still premature coronary artery disease in father who had CAD in his 23s.   Over the past few weeks he has noticed slight photophobia and has uses sunglasses with resolution.  States that this is very different from TIA-like episodes that he was having prior to carotid endarterectomy on the left.  Otherwise states that he is doing well and has resumed all his activities and is essentially asymptomatic.    Past Medical History:  Diagnosis Date  . Asymptomatic bilateral carotid artery stenosis   . Atrial flutter ([place])   . Dysrhythmia    PAF  . Essential hypertension   . GERD (gastroesophageal reflux disease)   . Hypercholesteremia   . Paroxysmal atrial fibrillation [facility])     Past Surgical History:  Procedure Laterality Date  . APPENDECTOMY  1960  . EYE SURGERY Bilateral    cataracts  . TRANSCAROTID ARTERY REVASCULARIZATION Left 08/10/2019   Procedure: TRANSCAROTID ARTERY REVASCULARIZATION LEFT USING ENROUTE TRANSCAROTID STENT;  Surgeon: Serafina Mitchell, MD;  Location: [facility] OR;  Service: Vascular;  Laterality: Left;   Social History   Tobacco Use  . Smoking status: Former Smoker    Years: 1.00    Types: Cigarettes    Quit date: 07/19/1959    Years since  quitting: 60.6  . Smokeless tobacco: Never Used  . Tobacco comment: smoked as a teen  Substance Use Topics  . Alcohol use: Yes    Alcohol/week: 7.0 standard drinks    Types: 7 Glasses of wine per week    Comment: 1-1.5 glasses of wine per day    Family History  Problem Relation Age of Onset  . CAD Father        died of MI at age 50  . CAD Brother     Review of Systems  Eyes: Positive for photophobia.  Cardiovascular: Negative for chest pain, dyspnea on exertion and leg swelling.  Gastrointestinal: Negative for melena.   Objective  Blood pressure (!) 122/50, pulse 62, resp. rate 16, height 6' (1.829 m), weight 184 lb (83.5 kg), SpO2 95 %. Body mass index is 24.95 kg/m.   Vitals with BMI 03/05/2020 09/05/2019 09/05/2019  Height 6' 0"  - 6' 0"   Weight 184 lbs - 186 lbs  BMI 93.81 - 01.75  Systolic 102 585 277  Diastolic 50 61 71  Pulse 62 - 56   Physical Exam Constitutional:      General: He is not in acute distress.    Appearance: He is well-developed.  HENT:     Head: Atraumatic.  Eyes:     Conjunctiva/sclera: Conjunctivae normal.  Neck:     Thyroid: No thyromegaly.     Vascular: No JVD.  Cardiovascular:     Rate and Rhythm: Normal rate. Rhythm  irregularly irregular.     Pulses: Normal pulses and intact distal pulses.          Carotid pulses are on the right side with bruit.    Heart sounds: Normal heart sounds. No murmur heard.  No gallop.      Comments: No edema. No JVD. Vascular exam except for right carotid bruit is normal.  Pulmonary:     Effort: Pulmonary effort is normal.     Breath sounds: Normal breath sounds.  Abdominal:     General: Bowel sounds are normal.     Palpations: Abdomen is soft.  Musculoskeletal:     Cervical back: Neck supple.    Laboratory examination:   CMP Latest Ref Rng & Units 08/11/2019 08/10/2019 07/25/2019  Glucose 70 - 99 mg/dL 147(H) 98 106(H)  BUN 8 - 23 mg/dL 15 21 17   Creatinine 0.61 - 1.24 mg/dL 0.76 0.98 0.96  Sodium 135 -  145 mmol/L 138 140 139  Potassium 3.5 - 5.1 mmol/L 4.1 3.9 4.0  Chloride 98 - 111 mmol/L 107 103 103  CO2 22 - 32 mmol/L 23 23 26   Calcium 8.9 - 10.3 mg/dL 9.1 9.1 9.5  Total Protein 6.5 - 8.1 g/dL - 6.9 7.4  Total Bilirubin 0.3 - 1.2 mg/dL - 1.0 0.7  Alkaline Phos 38 - 126 U/L - 46 54  AST 15 - 41 U/L - 23 28  ALT 0 - 44 U/L - 22 28   CBC Latest Ref Rng & Units 08/11/2019 08/10/2019 07/25/2019  WBC 4.0 - 10.5 K/uL 8.2 5.8 6.6  Hemoglobin 13.0 - 17.0 g/dL 12.0(L) 12.7(L) 14.1  Hematocrit 39 - 52 % 35.8(L) 39.9 43.8  Platelets 150 - 400 K/uL 291 296 330   External labs:  Labs 11/14/2019:  Hb 13.6/HCT 40.9, platelets 235, normal indicis.  Serum glucose 103 mg, BUN 26, creatinine 0.93, EGFR 79 mL, sodium 136, potassium 4.1, CMP otherwise normal.  Total cholesterol 173, triglycerides 65, HDL 77, LDL 84. NHDL 96  Vitamin D 32, vitamin B12 429.  Iron studies normal.  Medications   Current Outpatient Medications  Medication Instructions  . DILT-XR 180 MG 24 hr capsule TAKE 1 CAPSULE BY MOUTH 2 TIMES A DAY.  Marland Kitchen ezetimibe (ZETIA) 10 MG tablet TAKE 1 TABLET BY MOUTH EVERY DAY  . flecainide (TAMBOCOR) 50 mg, Oral, 2 times daily  . pantoprazole (PROTONIX) 20 mg, Oral, Daily  . rosuvastatin (CRESTOR) 20 MG tablet TAKE 1 TABLET BY MOUTH EVERY DAY  . valsartan-hydrochlorothiazide (DIOVAN HCT) 160-12.5 MG tablet 1 tablet, Oral, Daily  . XARELTO 20 MG TABS tablet TAKE 1 TABLET (20 MG TOTAL) BY MOUTH DAILY WITH SUPPER.   Radiology: CTA neck 08/01/2019: 1. Critical, flow-limiting stenosis proximal left internal carotid artery due to atherosclerotic disease. Estimated 90% diameter stenosis proximal left internal carotid artery 2. Atherosclerotic disease right carotid bifurcation with less than 25% diameter stenosis right internal carotid artery. 3. Moderate stenosis origin of left vertebral artery. Right vertebral artery widely patent without stenosis.  Cardiac Studies:   Echo 06/12/11: Normal  LVEF. Mild LVH. Trace AI.  Exercise sestamibi 2012: Exercise duration 12 min, 15 METs. Diaphragmatic attenuation. No ischemia. EF 64%. low risk.  Event Monitor 06/16/12 through 07/15/2012: Paroxysmal episodes of A. Fibrillation and A. Flutter, asymptomatic. Mostly rate controlled.  Carotid artery duplex  06/23/2019: Stenosis in the right internal carotid artery (16-49%). Critical stenosis in the left internal carotid artery (near occlusion). Reduced Doppler flow signal without velocity elevation.  Stenosis severity  may be an error due to heterogeneous plaque and calcification. Stenosis in the left external carotid artery (<50%). Antegrade right vertebral artery flow. Antegrade left vertebral artery flow. Compared to the study done on 01/06/2019, there is progression of left ICA stenosis. Follow up in four months is appropriate if clinically indicated. Consider angiography (CT vs invasive).   08/10/2019: Left transcarotid artery revascularization with stent placement (TCAR)     Carotid artery duplex  09/05/2019: Right Carotid: Velocities in the right ICA are consistent with a 1-39%  Stenosis.  Non-hemodynamically significant plaque <50% noted in the  CCA.  Left Carotid: There is no evidence of stenosis in the left ICA.  Hemodynamically  significant plaque >50% visualized in the CCA. No diastolic flow  noted throughout the left carotid artery consistent with distal stenosis or obstruction.  Vertebrals: Bilateral vertebral arteries demonstrate antegrade flow.  Subclavians: Normal flow hemodynamics were seen in bilateral subclavian arteries.   EKG:  EKG 03/05/2020: Sinus bradycardia at rate of 55 bpm with first-degree AV block, left atrial abnormality, poor R wave progression, probably normal variant.  No evidence of ischemia, normal QT interval.  No significant change from  07/19/2019.  EKG 08/31/2019: Course atrial fibrillation with controlled ventricular response at the rate of 93 bpm,  diffuse nonspecific ST-T abnormality.  Cannot exclude inferior and lateral ischemia.    Assessment     ICD-10-CM   1. Paroxysmal atrial fibrillation (HCC)  I48.0 EKG 12-Lead  2. Carotid stenosis, bilateral  I65.23 PCV CAROTID DUPLEX (BILATERAL)  3. History of left-sided carotid endarterectomy  Z98.890 PCV CAROTID DUPLEX (BILATERAL)  4. Essential hypertension  I10   5. Hypercholesteremia  E78.00    Recommendations:    TRANQUILINO FISCHLER  is a 74 y.o. male   BRENNEN CAMPER  is a 74 y.o. male   Caucasian male with history of hypertension, hyperlipidemia, asymptomatic carotid stenosis and  paroxysmal atrial fibrillation, underwent trans-carotid artery revascularization with flow reversal by Dr. Annamarie Major on 08/10/2019.  He has not had any further episodes of visual disturbances in the left eye.  Past medical history significant for hypertension, hyperlipidemia, paroxysmal atrial fibrillation and strong family still premature coronary artery disease in father who had CAD in his 5s. No further bruit in his left carotid.   He has developed mild photophobia for the past few months, it may be related to hydrochlorothiazide that he was started on last time along with valsartan switching him from losartan for uncontrolled hypertension.  His blood pressure is now well controlled, I reviewed his external records, labs including lipids especially in view of non-HDL cholesterol is well controlled.  Hence I did not make any changes to his medications.  With regard to photophobia, we could certainly discontinue HCT complaint of valsartan if it persist but patient states that since his blood pressure is well controlled and is essentially minimally symptomatic, he would like to continue the same for now.  I will repeat his carotid artery duplex for follow-up of right carotid stenosis and surveillance of left carotid endarterectomy and I would like to see him back in 3 months at that time and if he remains  stable on a 71-monthbasis.   JAdrian Prows MD, [facility]8/30/2021, 12:44 PM Office: [phone]

## 2020-04-11 ENCOUNTER — Other Ambulatory Visit: Payer: Self-pay

## 2020-04-12 DIAGNOSIS — L239 Allergic contact dermatitis, unspecified cause: Secondary | ICD-10-CM | POA: Diagnosis not present

## 2020-04-16 NOTE — Telephone Encounter (Signed)
Can I fill this

## 2020-04-18 ENCOUNTER — Telehealth: Payer: Self-pay

## 2020-04-18 ENCOUNTER — Other Ambulatory Visit: Payer: Self-pay | Admitting: Cardiology

## 2020-04-18 DIAGNOSIS — D649 Anemia, unspecified: Secondary | ICD-10-CM | POA: Diagnosis not present

## 2020-04-18 DIAGNOSIS — K635 Polyp of colon: Secondary | ICD-10-CM | POA: Diagnosis not present

## 2020-04-18 DIAGNOSIS — Z1211 Encounter for screening for malignant neoplasm of colon: Secondary | ICD-10-CM | POA: Diagnosis not present

## 2020-04-18 DIAGNOSIS — D123 Benign neoplasm of transverse colon: Secondary | ICD-10-CM | POA: Diagnosis not present

## 2020-04-18 MED ORDER — FLECAINIDE ACETATE 50 MG PO TABS: 50.0000 mg | ORAL_TABLET | Freq: Two times a day (BID) | ORAL | 3 refills | Status: DC

## 2020-04-18 MED ORDER — RIVAROXABAN 20 MG PO TABS: ORAL_TABLET | ORAL | 3 refills | Status: DC

## 2020-04-18 NOTE — Telephone Encounter (Signed)
Patient called requesting refill request on Flecainide 50MG  and Xarelto 20mg . Please advise.

## 2020-04-23 DIAGNOSIS — L239 Allergic contact dermatitis, unspecified cause: Secondary | ICD-10-CM | POA: Diagnosis not present

## 2020-04-25 DIAGNOSIS — L239 Allergic contact dermatitis, unspecified cause: Secondary | ICD-10-CM | POA: Diagnosis not present

## 2020-04-26 DIAGNOSIS — L239 Allergic contact dermatitis, unspecified cause: Secondary | ICD-10-CM | POA: Diagnosis not present

## 2020-05-28 ENCOUNTER — Other Ambulatory Visit: Payer: Medicare Other

## 2020-05-29 ENCOUNTER — Other Ambulatory Visit: Payer: Self-pay

## 2020-05-29 ENCOUNTER — Ambulatory Visit: Payer: Medicare Other

## 2020-05-29 DIAGNOSIS — I6523 Occlusion and stenosis of bilateral carotid arteries: Secondary | ICD-10-CM | POA: Diagnosis not present

## 2020-05-29 DIAGNOSIS — Z9889 Other specified postprocedural states: Secondary | ICD-10-CM

## 2020-06-01 ENCOUNTER — Other Ambulatory Visit: Payer: Self-pay | Admitting: Cardiology

## 2020-06-01 DIAGNOSIS — I6523 Occlusion and stenosis of bilateral carotid arteries: Secondary | ICD-10-CM

## 2020-06-01 DIAGNOSIS — Z95828 Presence of other vascular implants and grafts: Secondary | ICD-10-CM

## 2020-06-04 ENCOUNTER — Other Ambulatory Visit: Payer: Self-pay

## 2020-06-04 ENCOUNTER — Ambulatory Visit: Payer: Medicare Other | Admitting: Cardiology

## 2020-06-04 ENCOUNTER — Encounter: Payer: Self-pay | Admitting: Cardiology

## 2020-06-04 VITALS — BP 115/58 | HR 56 | Resp 16 | Ht 72.0 in | Wt 190.6 lb

## 2020-06-04 DIAGNOSIS — I480 Paroxysmal atrial fibrillation: Secondary | ICD-10-CM | POA: Diagnosis not present

## 2020-06-04 DIAGNOSIS — I6523 Occlusion and stenosis of bilateral carotid arteries: Secondary | ICD-10-CM

## 2020-06-04 DIAGNOSIS — Z95828 Presence of other vascular implants and grafts: Secondary | ICD-10-CM | POA: Diagnosis not present

## 2020-06-04 NOTE — Progress Notes (Signed)
Primary Physician/Referring:  Jani Gravel, MD  Patient ID: Douglas Griffith, male    DOB: May 31, 1946, 74 y.o.   MRN: [id number]  Chief Complaint  Patient presents with  . Carotid Stenosis  . Follow-up    3 month   Douglas Griffith  is a 74 y.o. male   Douglas Griffith  is a 74 y.o. male   Caucasian male with history of hypertension, hyperlipidemia, asymptomatic carotid stenosis and  paroxysmal atrial fibrillation, underwent trans-carotid artery revascularization with flow reversal by Dr. Annamarie Major on 08/10/2019.  He has not had any further episodes of visual disturbances in the left eye.  Past medical history significant for hypertension, hyperlipidemia, paroxysmal atrial fibrillation and strong family still premature coronary artery disease in father who had CAD in his 37s.   He is currently doing well, on his last office visit he had complained of photophobia which he states that has improved spontaneously.  Otherwise no new symptoms today.  Past Medical History:  Diagnosis Date  . Asymptomatic bilateral carotid artery stenosis   . Atrial flutter ([place])   . Dysrhythmia    PAF  . Essential hypertension   . GERD (gastroesophageal reflux disease)   . Hypercholesteremia   . Paroxysmal atrial fibrillation [facility])     Past Surgical History:  Procedure Laterality Date  . APPENDECTOMY  1960  . EYE SURGERY Bilateral    cataracts  . TRANSCAROTID ARTERY REVASCULARIZATION Left 08/10/2019   Procedure: TRANSCAROTID ARTERY REVASCULARIZATION LEFT USING ENROUTE TRANSCAROTID STENT;  Surgeon: Serafina Mitchell, MD;  Location: [facility] OR;  Service: Vascular;  Laterality: Left;   Social History   Tobacco Use  . Smoking status: Former Smoker    Years: 1.00    Types: Cigarettes    Quit date: 07/19/1959    Years since quitting: 60.9  . Smokeless tobacco: Never Used  . Tobacco comment: smoked as a teen  Substance Use Topics  . Alcohol use: Yes    Alcohol/week: 7.0 standard drinks    Types: 7  Glasses of wine per week    Comment: 1-1.5 glasses of wine per day    Family History  Problem Relation Age of Onset  . CAD Father        died of MI at age 55  . CAD Brother     Review of Systems  Eyes: Positive for photophobia.  Cardiovascular: Negative for chest pain, dyspnea on exertion and leg swelling.  Gastrointestinal: Negative for melena.   Objective  Blood pressure (!) 115/58, pulse (!) 56, resp. rate 16, height 6' (1.829 m), weight 190 lb 9.6 oz (86.5 kg), SpO2 96 %. Body mass index is 25.85 kg/m.   Vitals with BMI 06/04/2020 03/05/2020 09/05/2019  Height 6' 0"  6' 0"  -  Weight 190 lbs 10 oz 184 lbs -  BMI 41.96 22.29 -  Systolic 798 921 194  Diastolic 58 50 61  Pulse 56 62 -   Physical Exam Constitutional:      General: He is not in acute distress.    Appearance: He is well-developed.  HENT:     Head: Atraumatic.  Eyes:     Conjunctiva/sclera: Conjunctivae normal.  Neck:     Thyroid: No thyromegaly.     Vascular: No JVD.  Cardiovascular:     Rate and Rhythm: Normal rate. Rhythm irregularly irregular.     Pulses: Normal pulses and intact distal pulses.          Carotid pulses are on the  right side with bruit.    Heart sounds: Normal heart sounds. No murmur heard.  No gallop.      Comments: No edema. No JVD. Vascular exam except for right carotid bruit is normal.  Pulmonary:     Effort: Pulmonary effort is normal.     Breath sounds: Normal breath sounds.  Abdominal:     General: Bowel sounds are normal.     Palpations: Abdomen is soft.  Musculoskeletal:     Cervical back: Neck supple.    Laboratory examination:   CMP Latest Ref Rng & Units 08/11/2019 08/10/2019 07/25/2019  Glucose 70 - 99 mg/dL 147(H) 98 106(H)  BUN 8 - 23 mg/dL 15 21 17   Creatinine 0.61 - 1.24 mg/dL 0.76 0.98 0.96  Sodium 135 - 145 mmol/L 138 140 139  Potassium 3.5 - 5.1 mmol/L 4.1 3.9 4.0  Chloride 98 - 111 mmol/L 107 103 103  CO2 22 - 32 mmol/L 23 23 26   Calcium 8.9 - 10.3 mg/dL 9.1  9.1 9.5  Total Protein 6.5 - 8.1 g/dL - 6.9 7.4  Total Bilirubin 0.3 - 1.2 mg/dL - 1.0 0.7  Alkaline Phos 38 - 126 U/L - 46 54  AST 15 - 41 U/L - 23 28  ALT 0 - 44 U/L - 22 28   CBC Latest Ref Rng & Units 08/11/2019 08/10/2019 07/25/2019  WBC 4.0 - 10.5 K/uL 8.2 5.8 6.6  Hemoglobin 13.0 - 17.0 g/dL 12.0(L) 12.7(L) 14.1  Hematocrit 39 - 52 % 35.8(L) 39.9 43.8  Platelets 150 - 400 K/uL 291 296 330   External labs:  Labs 11/14/2019:  Hb 13.6/HCT 40.9, platelets 235, normal indicis.  Serum glucose 103 mg, BUN 26, creatinine 0.93, EGFR 79 mL, sodium 136, potassium 4.1, CMP otherwise normal.  Total cholesterol 173, triglycerides 65, HDL 77, LDL 84. NHDL 96  Vitamin D 32, vitamin B12 429.  Iron studies normal.  Medications   Current Outpatient Medications on File Prior to Visit  Medication Sig Dispense Refill  . DILT-XR 180 MG 24 hr capsule TAKE 1 CAPSULE BY MOUTH 2 TIMES A DAY. 180 capsule 3  . ezetimibe (ZETIA) 10 MG tablet TAKE 1 TABLET BY MOUTH EVERY DAY 90 tablet 1  . flecainide (TAMBOCOR) 50 MG tablet Take 1 tablet (50 mg total) by mouth 2 (two) times daily. 180 tablet 3  . fluocinonide ointment (LIDEX) 5.62 % Apply 1 application topically 2 (two) times daily.    . pantoprazole (PROTONIX) 20 MG tablet Take 20 mg by mouth daily.    . rivaroxaban (XARELTO) 20 MG TABS tablet TAKE 1 TABLET (20 MG TOTAL) BY MOUTH DAILY WITH SUPPER. 90 tablet 3  . rosuvastatin (CRESTOR) 20 MG tablet TAKE 1 TABLET BY MOUTH EVERY DAY 90 tablet 1  . valsartan-hydrochlorothiazide (DIOVAN HCT) 160-12.5 MG tablet Take 1 tablet by mouth daily. 90 tablet 3   No current facility-administered medications on file prior to visit.    Radiology: CTA neck 08/01/2019: 1. Critical, flow-limiting stenosis proximal left internal carotid artery due to atherosclerotic disease. Estimated 90% diameter stenosis proximal left internal carotid artery 2. Atherosclerotic disease right carotid bifurcation with less than 25%  diameter stenosis right internal carotid artery. 3. Moderate stenosis origin of left vertebral artery. Right vertebral artery widely patent without stenosis.  Cardiac Studies:   Echo 06/12/11: Normal LVEF. Mild LVH. Trace AI.  Exercise sestamibi 2012: Exercise duration 12 min, 15 METs. Diaphragmatic attenuation. No ischemia. EF 64%. low risk.  Event Monitor 06/16/12 through 07/15/2012:  Paroxysmal episodes of A. Fibrillation and A. Flutter, asymptomatic. Mostly rate controlled.  Carotid artery duplex  06/23/2019: Stenosis in the right internal carotid artery (16-49%). Critical stenosis in the left internal carotid artery (near occlusion). Reduced Doppler flow signal without velocity elevation.  Stenosis severity may be an error due to heterogeneous plaque and calcification. Stenosis in the left external carotid artery (<50%). Antegrade right vertebral artery flow. Antegrade left vertebral artery flow. Compared to the study done on 01/06/2019, there is progression of left ICA stenosis. Follow up in four months is appropriate if clinically indicated. Consider angiography (CT vs invasive).   08/10/2019: Left transcarotid artery revascularization with stent placement (TCAR)     Carotid artery duplex 05/29/2020: Stenosis in the right internal carotid artery (16-49%). Stenosis in the right common carotid artery (<50%). Stenosis in the right external carotid artery (<50%). Stenosis in the left internal carotid artery (16-49%).   Stenosis in the left external carotid artery (<50%). Antegrade right vertebral artery flow. Antegrade left vertebral artery flow. Follow up in one year is appropriate if clinically indicated. Compared to the study done on 06-23-2019, critical stenosis in the left ICA no longer present. Patient has history of TCAR left carotid with stenting. Study suggests patency.     EKG:  EKG 03/05/2020: Sinus bradycardia at rate of 55 bpm with first-degree AV block, left atrial  abnormality, poor R wave progression, probably normal variant.  No evidence of ischemia, normal QT interval.  No significant change from  07/19/2019.  EKG 08/31/2019: Course atrial fibrillation with controlled ventricular response at the rate of 93 bpm, diffuse nonspecific ST-T abnormality.  Cannot exclude inferior and lateral ischemia.    Assessment     ICD-10-CM   1. Asymptomatic bilateral carotid artery stenosis  I65.23   2. Presence of internal carotid stent: TCAR 08/10/2019  Z95.828   3. Paroxysmal atrial fibrillation (HCC)  I48.0    Recommendations:    Douglas Griffith  is a 74 y.o. male   Douglas Griffith  is a 74 y.o. male   Caucasian male with history of hypertension, hyperlipidemia, asymptomatic carotid stenosis and  paroxysmal atrial fibrillation, underwent trans-carotid artery revascularization with flow reversal by Dr. Annamarie Major on 08/10/2019.  He has not had any further episodes of visual disturbances in the left eye.  Past medical history significant for hypertension, hyperlipidemia, paroxysmal atrial fibrillation and strong family still premature coronary artery disease in father who had CAD in his 10s. No further bruit in his left carotid.   He has developed mild photophobia for the past few months, it may be related to hydrochlorothiazide that he was started on last time along with valsartan switching him from losartan for uncontrolled hypertension.  However patient prefers to continue present medications as he is doing well.  He has no dyspnea, no chest pain, has been walking for at least 2 miles every day at 15 minutes a mile.  Physical examination is unremarkable, except for soft bruit in his right carotid no abnormalities.  Blood pressure is also well controlled.  Lipids are at goal.  I will see him back in 6 months or sooner if problems.  I will let Dr. Trula Slade know that he is doing well post TCAR.   Adrian Prows, MD, [facility] 06/04/2020, 10:11 AM Office: [phone]

## 2020-06-06 DIAGNOSIS — E559 Vitamin D deficiency, unspecified: Secondary | ICD-10-CM | POA: Diagnosis not present

## 2020-06-06 DIAGNOSIS — Z125 Encounter for screening for malignant neoplasm of prostate: Secondary | ICD-10-CM | POA: Diagnosis not present

## 2020-06-06 DIAGNOSIS — E7800 Pure hypercholesterolemia, unspecified: Secondary | ICD-10-CM | POA: Diagnosis not present

## 2020-06-06 DIAGNOSIS — R739 Hyperglycemia, unspecified: Secondary | ICD-10-CM | POA: Diagnosis not present

## 2020-06-06 DIAGNOSIS — I10 Essential (primary) hypertension: Secondary | ICD-10-CM | POA: Diagnosis not present

## 2020-06-13 DIAGNOSIS — E559 Vitamin D deficiency, unspecified: Secondary | ICD-10-CM | POA: Diagnosis not present

## 2020-06-13 DIAGNOSIS — R739 Hyperglycemia, unspecified: Secondary | ICD-10-CM | POA: Diagnosis not present

## 2020-06-13 DIAGNOSIS — I10 Essential (primary) hypertension: Secondary | ICD-10-CM | POA: Diagnosis not present

## 2020-06-13 DIAGNOSIS — I6529 Occlusion and stenosis of unspecified carotid artery: Secondary | ICD-10-CM | POA: Diagnosis not present

## 2020-08-14 IMAGING — CT CT ANGIO NECK
2 of 3 series · 9 of 32 positions shown, 14 images · IV contrast (iopamidol)
Comparison: None.

CLINICAL DATA: Carotid stenosis

Creatinine was obtained on site at [HOSPITAL] at [HOSPITAL].
Results: Creatinine 0.9 mg/dL.
EXAM:
CT ANGIOGRAPHY NECK
TECHNIQUE: Multidetector CT imaging of the neck was performed using the
standard protocol during bolus administration of intravenous
contrast. Multiplanar CT image reconstructions and MIPs were
obtained to evaluate the vascular anatomy. Carotid stenosis
measurements (when applicable) are obtained utilizing NASCET
criteria, using the distal internal carotid diameter as the
denominator.
CONTRAST:  75mL 29CFQY-YRW IOPAMIDOL (29CFQY-YRW) INJECTION 76%

[Series 5: carotid angio · axial · 0.54mm/px · z∈[-306,-108]mm · 7 of 133 slices shown, 12 images]
[im 17/133  soft-tissue]
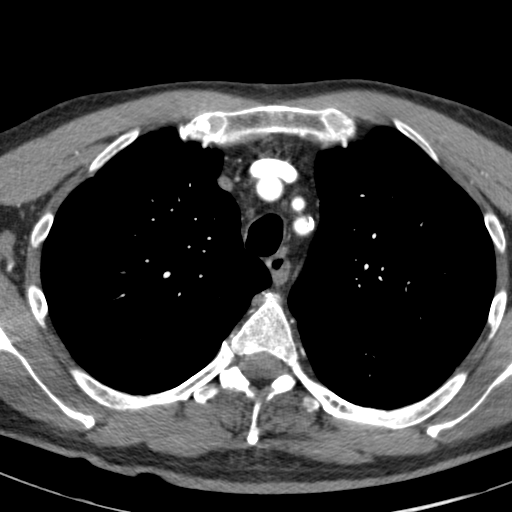
[im 17/133  bone]
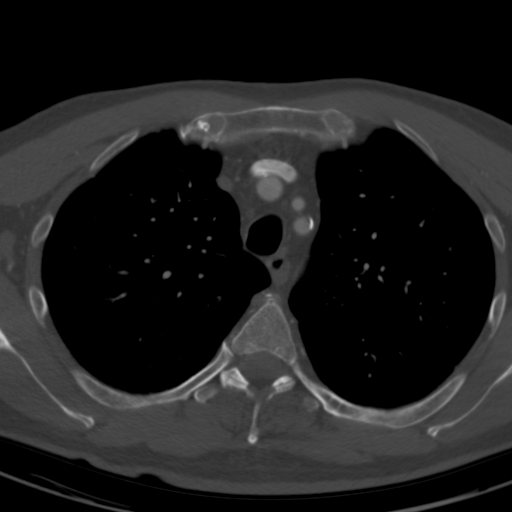
[im 34/133  soft-tissue]
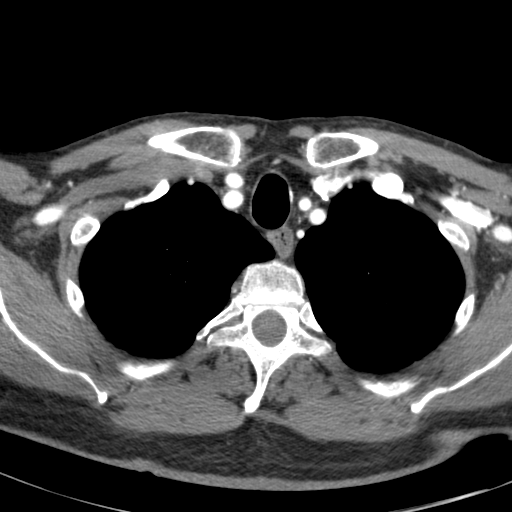
[im 50/133  soft-tissue]
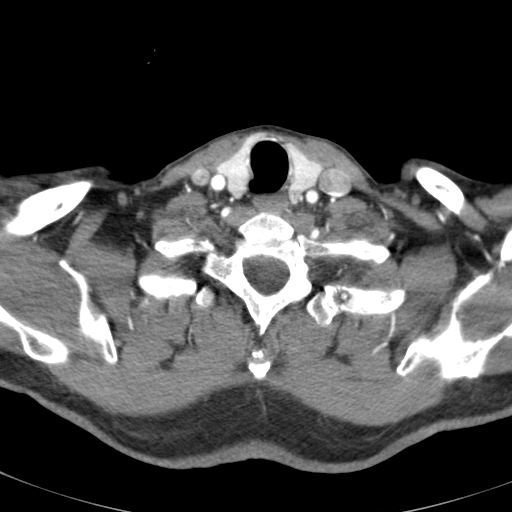
[im 67/133  soft-tissue]
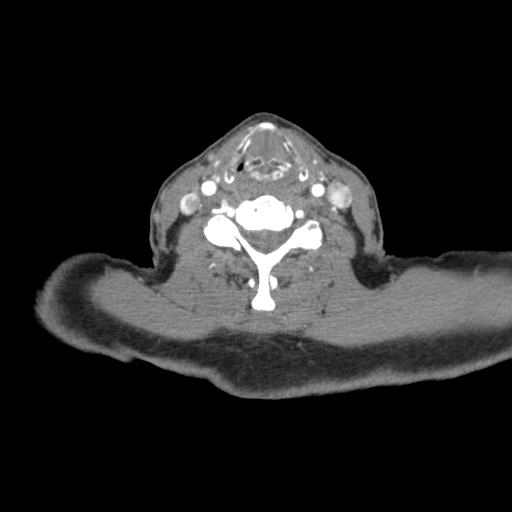
[im 67/133  lung]
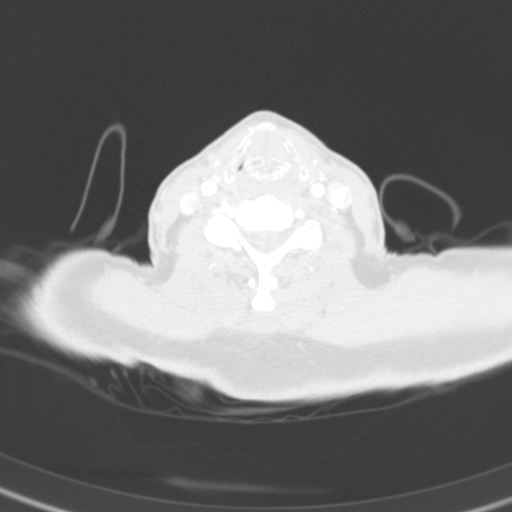
[im 83/133  soft-tissue]
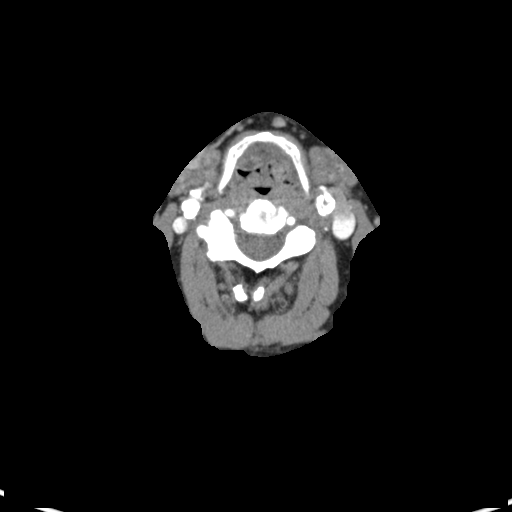
[im 83/133  lung]
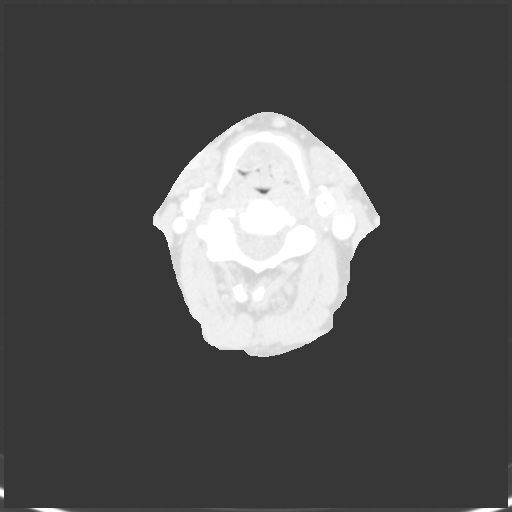
[im 100/133  soft-tissue]
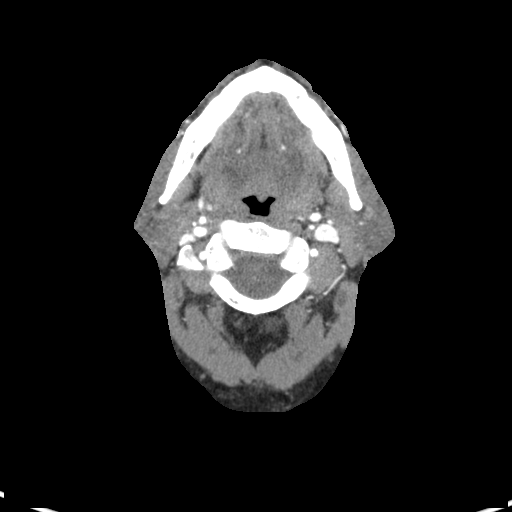
[im 100/133  lung]
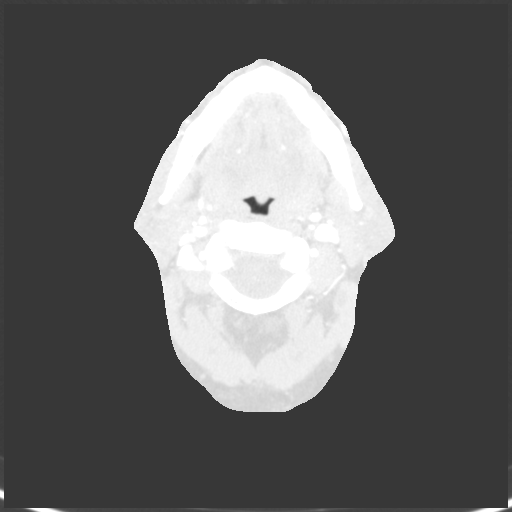
[im 116/133  soft-tissue]
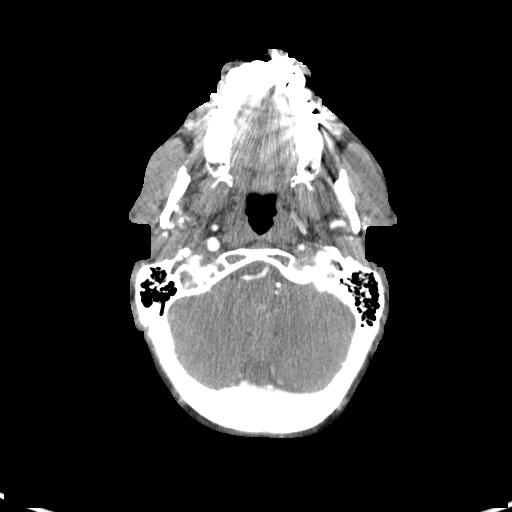
[im 116/133  lung]
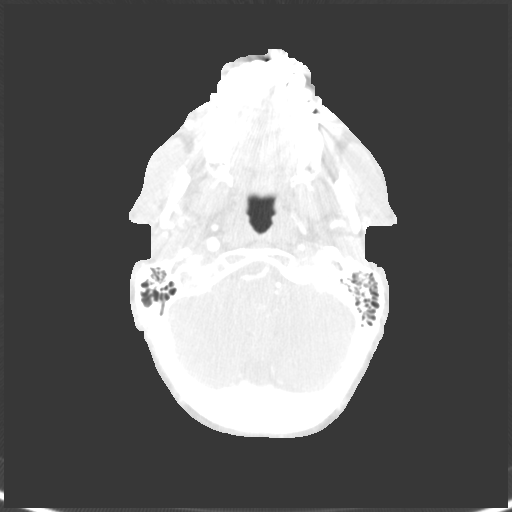

[Series 12: axial thick · axial · 0.55mm/px · z∈[-243,-163]mm · 2 of 49 slices shown]
[im 17/49  soft-tissue]
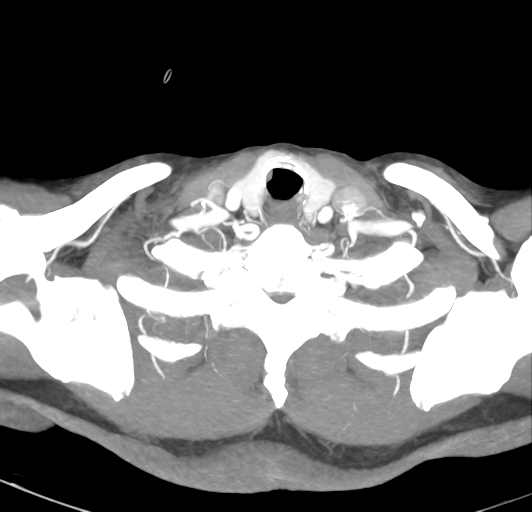
[im 33/49  soft-tissue]
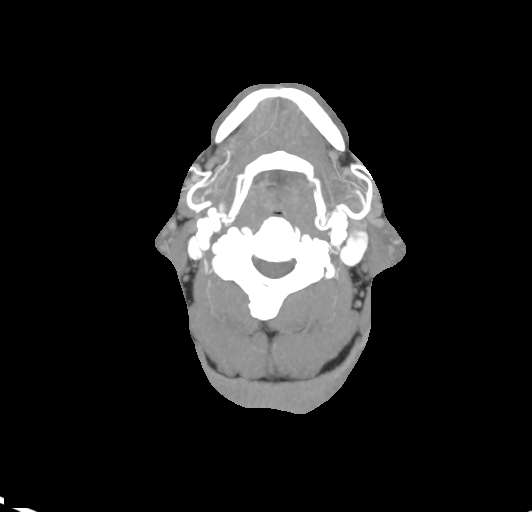

[9 of 32 positions shown; findings below may reference images not displayed]

FINDINGS: Aortic arch: Standard branching. Imaged portion shows no evidence of
aneurysm or dissection. No significant stenosis of the major arch
vessel origins. Atherosclerotic calcification in the aortic arch and
proximal great vessels.

Right carotid system: Right common carotid artery widely patent.
Atherosclerotic calcification right carotid bifurcation and proximal
right internal carotid artery. Approximately 25% diameter stenosis
right internal carotid artery.

Left carotid system: Left common carotid artery widely patent.
Atherosclerotic disease left carotid bifurcation. High-grade
stenosis proximal left internal carotid artery. The lumen is very
difficult to visualize due to small size and surrounding
calcification. Flow limiting stenosis with small caliber left
internal carotid artery which remains patent. Atherosclerotic
disease left cavernous carotid. Estimated 90% diameter stenosis
proximal left internal carotid artery.

Vertebral arteries: Right vertebral artery widely patent to the
basilar. Moderate stenosis origin of left vertebral artery which is
then patent to the basilar.

Skeleton: Cervical spine degenerative change. No acute skeletal
abnormality.

Other neck: Negative for mass or adenopathy.

Upper chest: Lung apices clear bilaterally.
IMPRESSION: 1. Critical, flow-limiting stenosis proximal left internal carotid
artery due to atherosclerotic disease. Estimated 90% diameter
stenosis proximal left internal carotid artery
2. Atherosclerotic disease right carotid bifurcation with less than
25% diameter stenosis right internal carotid artery.
3. Moderate stenosis origin of left vertebral artery. Right
vertebral artery widely patent without stenosis.

## 2020-08-22 ENCOUNTER — Other Ambulatory Visit: Payer: Self-pay | Admitting: Cardiology

## 2020-08-22 DIAGNOSIS — I10 Essential (primary) hypertension: Secondary | ICD-10-CM

## 2020-08-22 DIAGNOSIS — E7800 Pure hypercholesterolemia, unspecified: Secondary | ICD-10-CM

## 2020-11-26 ENCOUNTER — Ambulatory Visit: Payer: Medicare Other | Admitting: Cardiology

## 2020-12-20 ENCOUNTER — Encounter: Payer: Self-pay | Admitting: Cardiology

## 2020-12-20 ENCOUNTER — Other Ambulatory Visit: Payer: Self-pay

## 2020-12-20 ENCOUNTER — Ambulatory Visit: Payer: Medicare Other | Admitting: Cardiology

## 2020-12-20 VITALS — BP 128/66 | HR 67 | Temp 98.6°F | Resp 16 | Ht 72.0 in | Wt 189.0 lb

## 2020-12-20 DIAGNOSIS — E559 Vitamin D deficiency, unspecified: Secondary | ICD-10-CM | POA: Diagnosis not present

## 2020-12-20 DIAGNOSIS — D649 Anemia, unspecified: Secondary | ICD-10-CM | POA: Diagnosis not present

## 2020-12-20 DIAGNOSIS — E7800 Pure hypercholesterolemia, unspecified: Secondary | ICD-10-CM

## 2020-12-20 DIAGNOSIS — I6523 Occlusion and stenosis of bilateral carotid arteries: Secondary | ICD-10-CM

## 2020-12-20 DIAGNOSIS — R739 Hyperglycemia, unspecified: Secondary | ICD-10-CM | POA: Diagnosis not present

## 2020-12-20 DIAGNOSIS — I10 Essential (primary) hypertension: Secondary | ICD-10-CM

## 2020-12-20 DIAGNOSIS — Z125 Encounter for screening for malignant neoplasm of prostate: Secondary | ICD-10-CM | POA: Diagnosis not present

## 2020-12-20 DIAGNOSIS — I480 Paroxysmal atrial fibrillation: Secondary | ICD-10-CM | POA: Diagnosis not present

## 2020-12-20 DIAGNOSIS — Z0000 Encounter for general adult medical examination without abnormal findings: Secondary | ICD-10-CM | POA: Diagnosis not present

## 2020-12-20 DIAGNOSIS — Z95828 Presence of other vascular implants and grafts: Secondary | ICD-10-CM

## 2020-12-20 DIAGNOSIS — I6529 Occlusion and stenosis of unspecified carotid artery: Secondary | ICD-10-CM | POA: Diagnosis not present

## 2020-12-20 NOTE — Progress Notes (Signed)
Primary Physician/Referring:  Jani Gravel, MD  Patient ID: Douglas Griffith, male    DOB: [birth year], 75 y.o.   MRN: [id number]  Chief Complaint  Patient presents with   Atrial Fibrillation   Follow-up   Douglas Griffith  is a 75 y.o. male  patient with  history of hypertension, hyperlipidemia, asymptomatic carotid stenosis and  paroxysmal atrial fibrillation and strong family history of premature coronary artery disease in father who had CAD in his 65s. underwent trans-carotid artery revascularization with flow reversal by Dr. Annamarie Major on 08/10/2019.    He has not had any further episodes of visual disturbances in the left eye, however 6 months ago he had complained of mild photophobia which I suspect is probably related to hydrochlorothiazide.  He wanted to continue the medication.  He now has developed mild abdominal discomfort and is wondering whether valsartan HCT may be contributing to this.  Otherwise no new symptoms today.  Past Medical History:  Diagnosis Date   Asymptomatic bilateral carotid artery stenosis    Atrial flutter (HCC)    Dysrhythmia    PAF   Essential hypertension    GERD (gastroesophageal reflux disease)    Hypercholesteremia    Paroxysmal atrial fibrillation (HCC)     Past Surgical History:  Procedure Laterality Date   APPENDECTOMY  1960   EYE SURGERY Bilateral    cataracts   TRANSCAROTID ARTERY REVASCULARIZATION  Left 08/10/2019   Procedure: TRANSCAROTID ARTERY REVASCULARIZATION LEFT USING ENROUTE TRANSCAROTID STENT;  Surgeon: Serafina Mitchell, MD;  Location: [facility] OR;  Service: Vascular;  Laterality: Left;   Social History   Tobacco Use   Smoking status: Former    Packs/day: 1.00    Years: 1.00    Pack years: 1.00    Types: Cigarettes    Quit date: 07/19/1959    Years since quitting: 61.4   Smokeless tobacco: Never   Tobacco comments:    smoked as a teen  Substance Use Topics   Alcohol use: Yes    Alcohol/week: 7.0 standard drinks     Types: 7 Glasses of wine per week    Comment: 1-1.5 glasses of wine per day    Family History  Problem Relation Age of Onset   CAD Father        died of MI at age 76   CAD Brother     Review of Systems  Eyes:  Positive for photophobia.  Cardiovascular:  Negative for chest pain, dyspnea on exertion and leg swelling.  Gastrointestinal:  Negative for melena.  Objective  Blood pressure 128/66, pulse 67, temperature 98.6 F (37 C), resp. rate 16, height 6' (1.829 m), weight 189 lb (85.7 kg), SpO2 94 %. Body mass index is 25.63 kg/m.   Vitals with BMI 12/20/2020 06/04/2020 03/05/2020  Height _0  _1  _2   Weight 189 lbs 190 lbs 10 oz 184 lbs  BMI 25.63 98.72 15.87  Systolic 276 184 859  Diastolic 66 58 50  Pulse 67 56 62   Physical Exam Constitutional:      General: He is not in acute distress.    Appearance: He is well-developed.  HENT:     Head: Atraumatic.  Eyes:     Conjunctiva/sclera: Conjunctivae normal.  Neck:     Thyroid: No thyromegaly.     Vascular: No JVD.  Cardiovascular:     Rate and Rhythm: Normal rate. Rhythm irregularly irregular.     Pulses: Normal pulses and intact distal pulses.  Carotid pulses are  on the right side with bruit.    Heart sounds: Normal heart sounds. No murmur heard.   No gallop.     Comments: No edema. No JVD. Vascular exam except for right carotid bruit is normal.  Pulmonary:     Effort: Pulmonary effort is normal.     Breath sounds: Normal breath sounds.  Abdominal:     General: Bowel sounds are normal.     Palpations: Abdomen is soft.  Musculoskeletal:     Cervical back: Neck supple.   Laboratory examination:   CMP Latest Ref Rng & Units 08/11/2019 08/10/2019 07/25/2019  Glucose 70 - 99 mg/dL 147(H) 98 106(H)  BUN 8 - 23 mg/dL _0 Creatinine 0.61 - 1.24 mg/dL 0.76 0.98 0.96  Sodium 135 - 145 mmol/L 138 140 139  Potassium 3.5 - 5.1 mmol/L 4.1 3.9 4.0  Chloride 98 - 111 mmol/L 107 103 103  CO2 22 - 32 mmol/L _1 Calcium 8.9 - 10.3 mg/dL 9.1 9.1 9.5  Total Protein 6.5 - 8.1 g/dL - 6.9 7.4  Total Bilirubin 0.3 - 1.2 mg/dL - 1.0 0.7  Alkaline Phos 38 - 126 U/L - 46 54  AST 15 - 41 U/L - 23 28  ALT 0 - 44 U/L - 22 28   CBC Latest Ref Rng & Units 08/11/2019 08/10/2019 07/25/2019  WBC 4.0 - 10.5 K/uL 8.2 5.8 6.6  Hemoglobin 13.0 - 17.0 g/dL 12.0(L) 12.7(L) 14.1  Hematocrit 39.0 - 52.0 % 35.8(L) 39.9 43.8  Platelets 150 - 400 K/uL 291 296 330   External labs:  Labs 11/14/2019:  Labs 06/06/2020:  Hb 12.5/HCT 38.5, platelets 273.  Serum glucose 97 mg, P1 24, creatinine 0.99, EGFR 74 mL, potassium 4.3, CMP otherwise normal.  A1c 5.3%.  TSH normal.  Vitamin D 26.6.  Total cholesterol 145, triglycerides 113, HDL 51, LDL 74.  Hb 13.6/HCT 40.9, platelets 235, normal indicis.  Serum glucose 103 mg, BUN 26, creatinine 0.93, EGFR 79 mL, sodium 136, potassium 4.1, CMP otherwise normal.  Total cholesterol 173, triglycerides 65, HDL 77, LDL 84. NHDL 96  Vitamin D 32, vitamin B12 429.  Iron studies normal.  Medications   Current Outpatient Medications on File Prior to Visit  Medication Sig Dispense Refill   DILT-XR 180 MG 24 hr capsule TAKE 1 CAPSULE BY MOUTH 2 TIMES A DAY. 180 capsule 3   ezetimibe (ZETIA) 10 MG tablet TAKE 1 TABLET BY MOUTH EVERY DAY 90 tablet 1   flecainide (TAMBOCOR) 50 MG tablet Take 1 tablet (50 mg total) by mouth 2 (two) times daily. 180 tablet 3   fluocinonide ointment (LIDEX) 9.35 % Apply 1 application topically 2 (two) times daily.     pantoprazole (PROTONIX) 20 MG tablet Take 20 mg by mouth daily.     rivaroxaban (XARELTO) 20 MG TABS tablet TAKE 1 TABLET (20 MG TOTAL) BY MOUTH DAILY WITH SUPPER. 90 tablet 3   rosuvastatin (CRESTOR) 20 MG tablet TAKE 1 TABLET BY MOUTH EVERY DAY 90 tablet 1   valsartan-hydrochlorothiazide (DIOVAN-HCT) 160-12.5 MG tablet TAKE 1 TABLET BY MOUTH EVERY DAY 90 tablet 1   No current facility-administered medications on file prior to visit.     Radiology: CTA neck 08/01/2019: 1. Critical, flow-limiting stenosis proximal left internal carotid artery due to atherosclerotic disease. Estimated 90% diameter stenosis proximal left internal carotid artery 2. Atherosclerotic disease right carotid bifurcation with less than 25% diameter stenosis right internal carotid artery. 3.  Moderate stenosis origin of left vertebral artery. Right vertebral artery widely patent without stenosis.  Cardiac Studies:   Echo 06/12/11: Normal LVEF. Mild LVH. Trace AI.  Exercise sestamibi 2012: Exercise duration 12 min, 15 METs. Diaphragmatic attenuation. No ischemia. EF 64%. low risk.  Event Monitor 06/16/12 through 07/15/2012: Paroxysmal episodes of A. Fibrillation and A. Flutter, asymptomatic. Mostly rate controlled.  Carotid artery duplex  06/23/2019: Stenosis in the right internal carotid artery (16-49%). Critical stenosis in the left internal carotid artery (near occlusion). Reduced Doppler flow signal without velocity elevation.  Stenosis severity may be an error due to heterogeneous plaque and calcification. Stenosis in the left external carotid artery (<50%). Antegrade right vertebral artery flow. Antegrade left vertebral artery flow. Compared to the study done on 01/06/2019, there is progression of left ICA stenosis. Follow up in four months is appropriate if clinically indicated. Consider angiography (CT vs invasive).   08/10/2019: Left transcarotid artery revascularization with stent placement (TCAR)     Carotid artery duplex 05/29/2020: Stenosis in the right internal carotid artery (16-49%). Stenosis in the right common carotid artery (<50%). Stenosis in the right external carotid artery (<50%). Stenosis in the left internal carotid artery (16-49%).   Stenosis in the left external carotid artery (<50%). Antegrade right vertebral artery flow. Antegrade left vertebral artery flow. Follow up in one year is appropriate if clinically indicated.  Compared to the study done on 06-23-2019, critical stenosis in the left ICA no longer present. Patient has history of TCAR left carotid with stenting. Study suggests patency.     EKG:  EKG 12/20/2020: Sinus bradycardia at rate of 56 bpm, left atrial enlargement, normal axis.  IVCD, borderline criteria for LVH.  No significant change from EKG 03/05/2020.   EKG 08/31/2019: Course atrial fibrillation with controlled ventricular response at the rate of 93 bpm, diffuse nonspecific ST-T abnormality.  Cannot exclude inferior and lateral ischemia.    Assessment     ICD-10-CM   1. Asymptomatic bilateral carotid artery stenosis  I65.23 EKG 12-Lead    2. Paroxysmal atrial fibrillation (HCC)  I48.0 EKG 12-Lead    3. Presence of internal carotid stent: TCAR 08/10/2019  Z95.828     4. Carotid stenosis, bilateral  I65.23     5. Essential hypertension  I10     6. Hypercholesteremia  E78.00      Recommendations:    Douglas Griffith  is a 75 y.o. ` Caucasian male patient with  history of hypertension, hyperlipidemia, asymptomatic carotid stenosis and  paroxysmal atrial fibrillation and strong family history of premature coronary artery disease in father who had CAD in his 72s. underwent trans-carotid artery revascularization with flow reversal by Dr. Annamarie Major on 08/10/2019.   He has developed mild photophobia for the past few months, it may be related to hydrochlorothiazide that he was started on last time along with valsartan switching him from losartan for uncontrolled hypertension.  Patient is also developed mild abdominal discomfort and thinks that it may be related to valsartan HCT as well.  Advised him that he can completely stop the medication for a week and see whether his symptoms would get better.  We could certainly consider just switching him over to plain valsartan.  He will stop each one of the medications for a week and see which of the medications is causing him abdominal  discomfort.  Otherwise his physical examination has not changed, no clinical evidence of heart failure.  He is maintaining sinus rhythm.  Carotid artery duplex is  been scheduled for 6 months, I will see him back then.  His external labs reviewed.  I encouraged him to take vitamin D if it is still low, he had blood work done this morning with his PCP.      Adrian Prows, MD, [facility] 12/20/2020, 12:39 PM Office: [phone]

## 2021-01-03 NOTE — Progress Notes (Signed)
Labs 12/20/2020:  PSA normal at 2.70, vitamin D 35.8, TSH normal at 1.67. A1c 5.4%.  Total cholesterol 162, triglycerides 96, HDL 64, LDL 79.  Non-HDL cholesterol 98.  Hb 13.0, HCT 39.8, platelets 187, normal indicis.  BUN 23, creatinine 1.07, potassium 4.4, EGFR 66 mL.

## 2021-02-14 ENCOUNTER — Other Ambulatory Visit: Payer: Self-pay | Admitting: Cardiology

## 2021-02-14 DIAGNOSIS — I10 Essential (primary) hypertension: Secondary | ICD-10-CM

## 2021-02-14 DIAGNOSIS — E7800 Pure hypercholesterolemia, unspecified: Secondary | ICD-10-CM

## 2021-04-11 ENCOUNTER — Other Ambulatory Visit: Payer: Self-pay | Admitting: Cardiology

## 2021-04-11 NOTE — Telephone Encounter (Signed)
Can I refill?

## 2021-04-22 ENCOUNTER — Other Ambulatory Visit: Payer: Self-pay | Admitting: Cardiology

## 2021-04-22 DIAGNOSIS — Z95828 Presence of other vascular implants and grafts: Secondary | ICD-10-CM

## 2021-04-22 DIAGNOSIS — I6523 Occlusion and stenosis of bilateral carotid arteries: Secondary | ICD-10-CM

## 2021-06-04 DIAGNOSIS — E7800 Pure hypercholesterolemia, unspecified: Secondary | ICD-10-CM | POA: Diagnosis not present

## 2021-06-04 DIAGNOSIS — D649 Anemia, unspecified: Secondary | ICD-10-CM | POA: Diagnosis not present

## 2021-06-04 DIAGNOSIS — R739 Hyperglycemia, unspecified: Secondary | ICD-10-CM | POA: Diagnosis not present

## 2021-06-04 DIAGNOSIS — E559 Vitamin D deficiency, unspecified: Secondary | ICD-10-CM | POA: Diagnosis not present

## 2021-06-11 DIAGNOSIS — I10 Essential (primary) hypertension: Secondary | ICD-10-CM | POA: Diagnosis not present

## 2021-06-11 DIAGNOSIS — K219 Gastro-esophageal reflux disease without esophagitis: Secondary | ICD-10-CM | POA: Diagnosis not present

## 2021-06-11 DIAGNOSIS — E7800 Pure hypercholesterolemia, unspecified: Secondary | ICD-10-CM | POA: Diagnosis not present

## 2021-06-11 DIAGNOSIS — I4891 Unspecified atrial fibrillation: Secondary | ICD-10-CM | POA: Diagnosis not present

## 2021-06-12 ENCOUNTER — Other Ambulatory Visit: Payer: Self-pay

## 2021-06-12 ENCOUNTER — Ambulatory Visit: Payer: Medicare Other

## 2021-06-12 DIAGNOSIS — Z95828 Presence of other vascular implants and grafts: Secondary | ICD-10-CM | POA: Diagnosis not present

## 2021-06-12 DIAGNOSIS — I6523 Occlusion and stenosis of bilateral carotid arteries: Secondary | ICD-10-CM | POA: Diagnosis not present

## 2021-06-13 NOTE — Progress Notes (Signed)
Labs 06/04/2021:  A1c 5.7%.  TSH normal at 2.52, vitamin D 31.4.  Hb 12.9/HCT 40.4, platelets 286, normal indicis.  BUN 22, creatinine 1.10, potassium 4.6, EGFR 64 mL, CMP otherwise normal.  Total cholesterol 155, triglycerides 72, HDL 59, LDL 82.Hb

## 2021-06-14 ENCOUNTER — Other Ambulatory Visit: Payer: Self-pay

## 2021-06-14 MED ORDER — RIVAROXABAN 20 MG PO TABS: ORAL_TABLET | ORAL | 3 refills | Status: DC

## 2021-06-20 ENCOUNTER — Other Ambulatory Visit: Payer: Self-pay

## 2021-06-20 ENCOUNTER — Ambulatory Visit: Payer: Medicare Other | Admitting: Cardiology

## 2021-06-20 ENCOUNTER — Encounter: Payer: Self-pay | Admitting: Cardiology

## 2021-06-20 VITALS — BP 116/58 | HR 56 | Temp 98.2°F | Resp 17 | Ht 72.0 in | Wt 189.8 lb

## 2021-06-20 DIAGNOSIS — I480 Paroxysmal atrial fibrillation: Secondary | ICD-10-CM | POA: Diagnosis not present

## 2021-06-20 DIAGNOSIS — I6523 Occlusion and stenosis of bilateral carotid arteries: Secondary | ICD-10-CM

## 2021-06-20 DIAGNOSIS — I10 Essential (primary) hypertension: Secondary | ICD-10-CM | POA: Diagnosis not present

## 2021-06-20 DIAGNOSIS — Z95828 Presence of other vascular implants and grafts: Secondary | ICD-10-CM | POA: Insufficient documentation

## 2021-06-20 NOTE — Progress Notes (Signed)
Primary Physician/Referring:  Jani Gravel, MD  Patient ID: Douglas Griffith, male    DOB: 1945/08/30, 75 y.o.   MRN: [id number]  Chief Complaint  Patient presents with   Follow-up    6 month   carotid stenosis   Atrial Fibrillation   Hypertension   Douglas Griffith  is a 75 y.o. male  patient with  history of hypertension, hyperlipidemia, asymptomatic carotid stenosis and  paroxysmal atrial fibrillation and strong family history of premature coronary artery disease in father who had CAD in his 89s. underwent trans-carotid artery revascularization with flow reversal by Dr. Annamarie Major on 08/10/2019.  Except for severe GERD, he remains asymptomatic.  Past Medical History:  Diagnosis Date   Asymptomatic bilateral carotid artery stenosis    Atrial flutter (HCC)    Dysrhythmia    PAF   Essential hypertension    GERD (gastroesophageal reflux disease)    Hypercholesteremia    Paroxysmal atrial fibrillation (HCC)     Past Surgical History:  Procedure Laterality Date   APPENDECTOMY  1960   EYE SURGERY Bilateral    cataracts   TRANSCAROTID ARTERY REVASCULARIZATION  Left 08/10/2019   Procedure: TRANSCAROTID ARTERY REVASCULARIZATION LEFT USING ENROUTE TRANSCAROTID STENT;  Surgeon: Serafina Mitchell, MD;  Location: [facility] OR;  Service: Vascular;  Laterality: Left;   Social History   Tobacco Use   Smoking status: Former    Packs/day: 1.00    Years: 1.00    Pack years: 1.00    Types: Cigarettes    Quit date: 07/19/1959    Years since quitting: 61.9   Smokeless tobacco: Never   Tobacco comments:    smoked as a teen  Substance Use Topics   Alcohol use: Not Currently    Alcohol/week: 7.0 standard drinks    Types: 7 Glasses of wine per week    Comment: Down to one glass of one    Family History  Problem Relation Age of Onset   CAD Father        died of MI at age 61   CAD Brother     Review of Systems  Eyes:  Negative for photophobia.  Cardiovascular:  Negative for chest  pain, dyspnea on exertion and leg swelling.  Gastrointestinal:  Negative for melena.  Objective  Blood pressure (!) 116/58, pulse (!) 56, temperature 98.2 F (36.8 C), temperature source Temporal, resp. rate 17, height 6' (1.829 m), weight 189 lb 12.8 oz (86.1 kg), SpO2 98 %. Body mass index is 25.74 kg/m.   Vitals with BMI 06/20/2021 12/20/2020 06/04/2020  Height _0  _1  _2   Weight 189 lbs 13 oz 189 lbs 190 lbs 10 oz  BMI 25.74 59.93 57.01  Systolic 779 390 300  Diastolic 58 66 58  Pulse 56 67 56   Physical Exam Constitutional:      General: He is not in acute distress.    Appearance: He is well-developed.  HENT:     Head: Atraumatic.  Eyes:     Conjunctiva/sclera: Conjunctivae normal.  Neck:     Thyroid: No thyromegaly.     Vascular: No JVD.  Cardiovascular:     Rate and Rhythm: Normal rate. Rhythm irregularly irregular.     Pulses: Normal pulses and intact distal pulses.          Carotid pulses are  on the right side with bruit.    Heart sounds: Normal heart sounds. No murmur heard.   No gallop.  Comments: No edema. No JVD. Vascular exam except for right carotid bruit is normal.  Pulmonary:     Effort: Pulmonary effort is normal.     Breath sounds: Normal breath sounds.  Abdominal:     General: Bowel sounds are normal.     Palpations: Abdomen is soft.  Musculoskeletal:     Cervical back: Neck supple.   Laboratory examination:   CMP Latest Ref Rng & Units 08/11/2019 08/10/2019 07/25/2019  Glucose 70 - 99 mg/dL 147(H) 98 106(H)  BUN 8 - 23 mg/dL _0 Creatinine 0.61 - 1.24 mg/dL 0.76 0.98 0.96  Sodium 135 - 145 mmol/L 138 140 139  Potassium 3.5 - 5.1 mmol/L 4.1 3.9 4.0  Chloride 98 - 111 mmol/L 107 103 103  CO2 22 - 32 mmol/L _1 Calcium 8.9 - 10.3 mg/dL 9.1 9.1 9.5  Total Protein 6.5 - 8.1 g/dL - 6.9 7.4  Total Bilirubin 0.3 - 1.2 mg/dL - 1.0 0.7  Alkaline Phos 38 - 126 U/L - 46 54  AST 15 - 41 U/L - 23 28  ALT 0 - 44 U/L - 22 28   CBC  Latest Ref Rng & Units 08/11/2019 08/10/2019 07/25/2019  WBC 4.0 - 10.5 K/uL 8.2 5.8 6.6  Hemoglobin 13.0 - 17.0 g/dL 12.0(L) 12.7(L) 14.1  Hematocrit 39.0 - 52.0 % 35.8(L) 39.9 43.8  Platelets 150 - 400 K/uL 291 296 330   External labs:  Labs 06/04/2021:  A1c 5.7%.  TSH normal at 2.52, vitamin D 31.4.  Hb 12.9/HCT 40.4, platelets 286, normal indicis.  BUN 22, creatinine 1.10, potassium 4.6, EGFR 64 mL, CMP otherwise normal.  Total cholesterol 155, triglycerides 72, HDL 59, LDL 82.Hb  Labs 12/20/2020:  PSA normal at 2.70, vitamin D 35.8, TSH normal at 1.67. A1c 5.4%.  Total cholesterol 162, triglycerides 96, HDL 64, LDL 79.  Non-HDL cholesterol 98.   Medications   Current Outpatient Medications on File Prior to Visit  Medication Sig Dispense Refill   DILT-XR 180 MG 24 hr capsule TAKE 1 CAPSULE BY MOUTH TWICE A DAY 180 capsule 3   ezetimibe (ZETIA) 10 MG tablet TAKE 1 TABLET BY MOUTH EVERY DAY 90 tablet 1   flecainide (TAMBOCOR) 50 MG tablet TAKE 1 TABLET BY MOUTH TWICE A DAY 180 tablet 3   pantoprazole (PROTONIX) 40 MG tablet Take 40 mg by mouth daily.     rivaroxaban (XARELTO) 20 MG TABS tablet TAKE 1 TABLET (20 MG TOTAL) BY MOUTH DAILY WITH SUPPER. 90 tablet 3   rosuvastatin (CRESTOR) 20 MG tablet TAKE 1 TABLET BY MOUTH EVERY DAY 90 tablet 1   valsartan-hydrochlorothiazide (DIOVAN-HCT) 160-12.5 MG tablet TAKE 1 TABLET BY MOUTH EVERY DAY (Patient taking differently: 0.5 tablets.) 90 tablet 1   No current facility-administered medications on file prior to visit.    Radiology: CTA neck 08/01/2019: 1. Critical, flow-limiting stenosis proximal left internal carotid artery due to atherosclerotic disease. Estimated 90% diameter stenosis proximal left internal carotid artery 2. Atherosclerotic disease right carotid bifurcation with less than 25% diameter stenosis right internal carotid artery. 3. Moderate stenosis origin of left vertebral artery. Right vertebral artery widely patent  without stenosis.  Cardiac Studies:   Echo 06/12/11: Normal LVEF. Mild LVH. Trace AI.  Exercise sestamibi 2012: Exercise duration 12 min, 15 METs. Diaphragmatic attenuation. No ischemia. EF 64%. low risk.  Event Monitor 06/16/12 through 07/15/2012: Paroxysmal episodes of A. Fibrillation and A. Flutter, asymptomatic. Mostly rate controlled.  08/10/2019: Left transcarotid artery  revascularization with stent placement (TCAR)     Carotid artery duplex 06/12/2021: Duplex suggests stenosis in the right internal carotid artery (16-49%). There is a stent placed in the left ICA-prox which is patent.  Duplex suggests stenosis in the left internal carotid artery (16-49%).  Antegrade left vertebral artery flow. No significant change from 05/29/2020. Follow up in one year is appropriate if clinically indicated.   EKG:  EKG 06/20/2021: Sinus bradycardia at rate of 53 bpm, normal axis, no evidence of ischemia, normal EKG.    EKG 08/31/2019: Course atrial fibrillation with controlled ventricular response at the rate of 93 bpm, diffuse nonspecific ST-T abnormality.  Cannot exclude inferior and lateral ischemia.    Assessment     ICD-10-CM   1. Asymptomatic bilateral carotid artery stenosis  I65.23 EKG 12-Lead    PCV CAROTID DUPLEX (BILATERAL)    2. Presence of internal carotid stent: TCAR 08/10/2019  Z95.828     3. Paroxysmal atrial fibrillation (HCC)  I48.0     4. Essential hypertension  I10      CHA2DS2-VASc Score is 4.  Yearly risk of stroke: 4.8% (A, HTN, Vasc Dz).  Score of 1=0.6; 2=2.2; 3=3.2; 4=4.8; 5=7.2; 6=9.8; 7=>9.8) -(CHF; HTN; vasc disease DM,  Male = 1; Age <65 =0; 65-74 = 1,  >75 =2; stroke/embolism= 2).   Recommendations:    TOMAZ JANIS  is a 75 y.o. ` Caucasian male patient with  history of hypertension, hyperlipidemia, asymptomatic carotid stenosis and  paroxysmal atrial fibrillation and strong family history of premature coronary artery disease in father who had CAD  in his 25s. underwent trans-carotid artery revascularization with flow reversal by Dr. Annamarie Major on 08/10/2019.   Is presently doing well, essentially remains asymptomatic, he has reduced the dose of valsartan HCT to 1/2 tablet daily due to low blood pressure and dizziness.  His blood pressure is at excellent control.  He has not had any further photophobia since reducing the dose of the HCT.  His carotid artery duplex is unchanged.  We will repeat carotid duplex in a year for surveillance.  With regard to hyperlipidemia, LDL is >70 however he is presently 75 years of age and has remained stable.  We will continue to watch this, if there is any signal towards worsening carotid disease, I could certainly consider increasing his Crestor dose to 40 mg.  Is presently on Zetia as well.  Continue the same.  I will see him back in a year or sooner if problems.  I reviewed his external labs, CBC and renal function has remained stable.   Adrian Prows, MD, [facility] 06/20/2021, 11:29 AM Office: [phone]

## 2021-07-30 DIAGNOSIS — H43813 Vitreous degeneration, bilateral: Secondary | ICD-10-CM | POA: Diagnosis not present

## 2021-08-27 ENCOUNTER — Other Ambulatory Visit: Payer: Self-pay | Admitting: Cardiology

## 2021-08-27 DIAGNOSIS — E7800 Pure hypercholesterolemia, unspecified: Secondary | ICD-10-CM

## 2021-08-27 DIAGNOSIS — I10 Essential (primary) hypertension: Secondary | ICD-10-CM

## 2021-10-04 DIAGNOSIS — H43813 Vitreous degeneration, bilateral: Secondary | ICD-10-CM | POA: Diagnosis not present

## 2021-10-04 DIAGNOSIS — H26492 Other secondary cataract, left eye: Secondary | ICD-10-CM | POA: Diagnosis not present

## 2021-10-04 DIAGNOSIS — H40013 Open angle with borderline findings, low risk, bilateral: Secondary | ICD-10-CM | POA: Diagnosis not present

## 2021-10-04 DIAGNOSIS — H33193 Other retinoschisis and retinal cysts, bilateral: Secondary | ICD-10-CM | POA: Diagnosis not present

## 2021-10-15 DIAGNOSIS — H26492 Other secondary cataract, left eye: Secondary | ICD-10-CM | POA: Diagnosis not present

## 2021-10-15 DIAGNOSIS — H5211 Myopia, right eye: Secondary | ICD-10-CM | POA: Diagnosis not present

## 2021-10-23 DIAGNOSIS — K219 Gastro-esophageal reflux disease without esophagitis: Secondary | ICD-10-CM | POA: Diagnosis not present

## 2021-12-17 DIAGNOSIS — Z125 Encounter for screening for malignant neoplasm of prostate: Secondary | ICD-10-CM | POA: Diagnosis not present

## 2021-12-17 DIAGNOSIS — R7303 Prediabetes: Secondary | ICD-10-CM | POA: Diagnosis not present

## 2021-12-17 DIAGNOSIS — D649 Anemia, unspecified: Secondary | ICD-10-CM | POA: Diagnosis not present

## 2021-12-17 DIAGNOSIS — E7800 Pure hypercholesterolemia, unspecified: Secondary | ICD-10-CM | POA: Diagnosis not present

## 2022-01-13 DIAGNOSIS — I10 Essential (primary) hypertension: Secondary | ICD-10-CM | POA: Diagnosis not present

## 2022-01-13 DIAGNOSIS — I6529 Occlusion and stenosis of unspecified carotid artery: Secondary | ICD-10-CM | POA: Diagnosis not present

## 2022-01-13 DIAGNOSIS — Z0000 Encounter for general adult medical examination without abnormal findings: Secondary | ICD-10-CM | POA: Diagnosis not present

## 2022-01-13 DIAGNOSIS — E7800 Pure hypercholesterolemia, unspecified: Secondary | ICD-10-CM | POA: Diagnosis not present

## 2022-01-27 ENCOUNTER — Other Ambulatory Visit: Payer: Self-pay | Admitting: Cardiology

## 2022-02-23 ENCOUNTER — Other Ambulatory Visit: Payer: Self-pay | Admitting: Cardiology

## 2022-02-23 DIAGNOSIS — E7800 Pure hypercholesterolemia, unspecified: Secondary | ICD-10-CM

## 2022-04-15 ENCOUNTER — Other Ambulatory Visit: Payer: Self-pay | Admitting: Cardiology

## 2022-04-15 NOTE — Telephone Encounter (Signed)
Refill request

## 2022-06-09 DIAGNOSIS — I4891 Unspecified atrial fibrillation: Secondary | ICD-10-CM | POA: Diagnosis not present

## 2022-06-09 DIAGNOSIS — I10 Essential (primary) hypertension: Secondary | ICD-10-CM | POA: Diagnosis not present

## 2022-06-09 DIAGNOSIS — K219 Gastro-esophageal reflux disease without esophagitis: Secondary | ICD-10-CM | POA: Diagnosis not present

## 2022-06-09 DIAGNOSIS — Z23 Encounter for immunization: Secondary | ICD-10-CM | POA: Diagnosis not present

## 2022-06-20 ENCOUNTER — Ambulatory Visit: Payer: Medicare Other

## 2022-06-20 DIAGNOSIS — I6523 Occlusion and stenosis of bilateral carotid arteries: Secondary | ICD-10-CM

## 2022-06-21 ENCOUNTER — Other Ambulatory Visit: Payer: Self-pay | Admitting: Cardiology

## 2022-06-21 DIAGNOSIS — E7800 Pure hypercholesterolemia, unspecified: Secondary | ICD-10-CM

## 2022-06-24 ENCOUNTER — Ambulatory Visit: Payer: Medicare Other | Admitting: Cardiology

## 2022-06-24 ENCOUNTER — Other Ambulatory Visit: Payer: Self-pay | Admitting: Cardiology

## 2022-06-24 ENCOUNTER — Encounter: Payer: Self-pay | Admitting: Cardiology

## 2022-06-24 VITALS — BP 131/67 | HR 57 | Ht 72.0 in | Wt 182.0 lb

## 2022-06-24 DIAGNOSIS — I480 Paroxysmal atrial fibrillation: Secondary | ICD-10-CM | POA: Diagnosis not present

## 2022-06-24 DIAGNOSIS — K2100 Gastro-esophageal reflux disease with esophagitis, without bleeding: Secondary | ICD-10-CM

## 2022-06-24 DIAGNOSIS — I6523 Occlusion and stenosis of bilateral carotid arteries: Secondary | ICD-10-CM

## 2022-06-24 DIAGNOSIS — Z95828 Presence of other vascular implants and grafts: Secondary | ICD-10-CM

## 2022-06-24 DIAGNOSIS — I10 Essential (primary) hypertension: Secondary | ICD-10-CM | POA: Diagnosis not present

## 2022-06-24 MED ORDER — DEXLANSOPRAZOLE 60 MG PO CPDR: 60.0000 mg | DELAYED_RELEASE_CAPSULE | Freq: Every day | ORAL | 2 refills | Status: DC

## 2022-06-24 NOTE — Progress Notes (Signed)
Primary Physician/Referring:  Deon Pilling, NP  Patient ID: Douglas Griffith, male    DOB: 1946/06/15, 76 y.o.   MRN: [id number]  Chief Complaint  Patient presents with   bilateral carotid artery stenosis   Follow-up   Results   Douglas Griffith  is a 76 y.o. male  patient with  history of hypertension, hyperlipidemia, asymptomatic carotid stenosis and  paroxysmal atrial fibrillation and strong family history of premature coronary artery disease in father who had CAD in his 57s. underwent trans-carotid artery revascularization with flow reversal by Dr. Annamarie Major on 08/10/2019.  He has chronic severe GERD, however this has gotten really worse in the last 4 to 6 weeks.  He has tried multiple PPIs without any relief and has stopped drinking given a sip of water, anything that he eats bothers him severely.  Has lost some weight as well.  No bleeding, no dark stools.  He also states that he is in atrial fibrillation as he had reduced the dose of flecainide thinking that it may be inducing him to have GERD.  Presently otherwise remains asymptomatic.  Past Medical History:  Diagnosis Date   Asymptomatic bilateral carotid artery stenosis    Atrial flutter (HCC)    Dysrhythmia    PAF   Essential hypertension    GERD (gastroesophageal reflux disease)    Hypercholesteremia    Paroxysmal atrial fibrillation (HCC)     Past Surgical History:  Procedure Laterality Date   APPENDECTOMY  1960   EYE SURGERY Bilateral    cataracts   TRANSCAROTID ARTERY REVASCULARIZATION  Left 08/10/2019   Procedure: TRANSCAROTID ARTERY REVASCULARIZATION LEFT USING ENROUTE TRANSCAROTID STENT;  Surgeon: Serafina Mitchell, MD;  Location: [facility] OR;  Service: Vascular;  Laterality: Left;   Social History   Tobacco Use   Smoking status: Former    Packs/day: 1.00    Years: 1.00    Total pack years: 1.00    Types: Cigarettes    Quit date: 07/19/1959    Years since quitting: 62.9   Smokeless tobacco: Never    Tobacco comments:    smoked as a teen  Substance Use Topics   Alcohol use: Not Currently    Alcohol/week: 7.0 standard drinks of alcohol    Types: 7 Glasses of wine per week    Comment: Down to one glass of one    Family History  Problem Relation Age of Onset   CAD Father        died of MI at age 56   CAD Brother     Review of Systems  Cardiovascular:  Positive for irregular heartbeat. Negative for chest pain, dyspnea on exertion and leg swelling.  Gastrointestinal:  Positive for heartburn. Negative for melena.   Objective  Blood pressure 131/67, pulse (!) 57, height 6' (1.829 m), weight 182 lb (82.6 kg), SpO2 99 %. Body mass index is 24.68 kg/m.      06/24/2022   10:47 AM 06/20/2021   10:28 AM 12/20/2020   11:35 AM  Vitals with BMI  Height _0  _1  _2   Weight 182 lbs 189 lbs 13 oz 189 lbs  BMI 24.68 21.97 58.83  Systolic 254 982 641  Diastolic 67 58 66  Pulse 57 56 67   Physical Exam Constitutional:      General: He is not in acute distress.    Appearance: He is well-developed.  HENT:     Head: Atraumatic.  Eyes:     Conjunctiva/sclera: Conjunctivae  normal.  Neck:     Vascular: Carotid bruit (right) present. No JVD.  Cardiovascular:     Rate and Rhythm: Normal rate. Rhythm irregularly irregular.     Pulses: Normal pulses and intact distal pulses.          Carotid pulses are  on the right side with bruit.    Heart sounds: Normal heart sounds. No murmur heard.    No gallop.  Pulmonary:     Effort: Pulmonary effort is normal.     Breath sounds: Normal breath sounds.  Abdominal:     General: Bowel sounds are normal.     Palpations: Abdomen is soft.  Musculoskeletal:     Right lower leg: No edema.     Left lower leg: No edema.    Laboratory examination:   External labs:  Labs 12/18/2021:  A1c 5.7%.  TSH normal at 1.62.  Hb 12.7/HCT 38.9, platelets 274, normal indicis.  Sodium 142, potassium 4.3, BUN 19, creatinine 0.94, EGFR 77 mL, LFTs  normal.  Total cholesterol 138, triglycerides 64, HDL 55, LDL 70.  Labs 06/04/2021:  A1c 5.7%.  TSH normal at 2.52, vitamin D 31.4.  Hb 12.9/HCT 40.4, platelets 286, normal indicis.   Medications    Current Outpatient Medications:    dexlansoprazole (DEXILANT) 60 MG capsule, Take 1 capsule (60 mg total) by mouth daily., Disp: 30 capsule, Rfl: 2   DILT-XR 180 MG 24 hr capsule, TAKE 1 CAPSULE BY MOUTH TWICE A DAY (Patient taking differently: Take 180 mg by mouth daily.), Disp: 180 capsule, Rfl: 3   ezetimibe (ZETIA) 10 MG tablet, TAKE 1 TABLET BY MOUTH EVERY DAY, Disp: 90 tablet, Rfl: 1   famotidine (PEPCID) 40 MG tablet, Take 40 mg by mouth daily., Disp: , Rfl:    flecainide (TAMBOCOR) 50 MG tablet, TAKE 1 TABLET BY MOUTH TWICE A DAY, Disp: 180 tablet, Rfl: 3   rivaroxaban (XARELTO) 20 MG TABS tablet, TAKE 1 TABLET (20 MG TOTAL) BY MOUTH DAILY WITH SUPPER., Disp: 90 tablet, Rfl: 3   rosuvastatin (CRESTOR) 20 MG tablet, TAKE 1 TABLET BY MOUTH EVERY DAY, Disp: 90 tablet, Rfl: 1   valsartan-hydrochlorothiazide (DIOVAN-HCT) 160-12.5 MG tablet, TAKE 1 TABLET BY MOUTH EVERY DAY (Patient not taking: Reported on 06/24/2022), Disp: 90 tablet, Rfl: 1    Radiology: CTA neck 08/01/2019: 1. Critical, flow-limiting stenosis proximal left internal carotid artery due to atherosclerotic disease. Estimated 90% diameter stenosis proximal left internal carotid artery 2. Atherosclerotic disease right carotid bifurcation with less than 25% diameter stenosis right internal carotid artery. 3. Moderate stenosis origin of left vertebral artery. Right vertebral artery widely patent without stenosis.  Cardiac Studies:   Echo 06/12/11: Normal LVEF. Mild LVH. Trace AI.  Exercise sestamibi 2012: Exercise duration 12 min, 15 METs. Diaphragmatic attenuation. No ischemia. EF 64%. low risk.  Event Monitor 06/16/12 through 07/15/2012: Paroxysmal episodes of A. Fibrillation and A. Flutter, asymptomatic. Mostly rate  controlled.  08/10/2019: Left transcarotid artery revascularization with stent placement (TCAR)     Carotid artery duplex 06/20/2022: Duplex suggests stenosis in the right internal carotid artery (16-49%). There is a patent stent placed in the left carotid from the ICA-prox to the ICA-mid.  Antegrade right vertebral artery flow. Antegrade left vertebral artery flow. Compared to the study done on 06/12/2021, no significant change. Follow up in one year is appropriate if clinically indicated.   EKG:  EKG 06/20/2021: Sinus bradycardia at rate of 53 bpm, normal axis, no evidence of ischemia, normal EKG.  EKG 08/31/2019: Course atrial fibrillation with controlled ventricular response at the rate of 93 bpm, diffuse nonspecific ST-T abnormality.  Cannot exclude inferior and lateral ischemia.    Assessment     ICD-10-CM   1. Asymptomatic bilateral carotid artery stenosis  I65.23 EKG 12-Lead    [facility] CAROTID DUPLEX (BILATERAL)    2. Presence of internal carotid stent: left ICA TCAR 08/10/2019  Z95.828 [facility] CAROTID DUPLEX (BILATERAL)    3. Paroxysmal atrial fibrillation (HCC)  I48.0 [facility] ECHOCARDIOGRAM COMPLETE    4. Essential hypertension  I10     5. Gastroesophageal reflux disease with esophagitis without hemorrhage  K21.00 dexlansoprazole (DEXILANT) 60 MG capsule     CHA2DS2-VASc Score is 4.  Yearly risk of stroke: 4.8% (A, HTN, Vasc Dz).  Score of 1=0.6; 2=2.2; 3=3.2; 4=4.8; 5=7.2; 6=9.8; 7=>9.8) -(CHF; HTN; vasc disease DM,  Male = 1; Age <65 =0; 65-74 = 1,  >75 =2; stroke/embolism= 2).   Recommendations:    Douglas Griffith  is a 76 y.o. Caucasian male patient with  history of hypertension, hyperlipidemia, asymptomatic carotid stenosis and  paroxysmal atrial fibrillation and strong family history of premature coronary artery disease in father who had CAD in his 47s. underwent trans-carotid artery revascularization with flow reversal by Dr. Annamarie Major on 08/10/2019.   1.  Asymptomatic bilateral carotid artery stenosis Left TCAR is widely patent.  Very mild disease in the right, will need continued surveillance in a year.  Lipids are under excellent control.  2. Presence of internal carotid stent: left ICA TCAR 08/10/2019 As dictated above, patency is noted by recent duplex.  3. Paroxysmal atrial fibrillation [facility]) Patient is back in atrial fibrillation.  He does have episodes of paroxysmal atrial fibrillation but he spontaneously converts back to sinus rhythm.  Since his recent severe GI issues including severe GERD, he has reduced the dose of the flecainide to once a day and I suspect subtherapeutic dose led to recurrence of A-fib.  Advised him to increase flecainide to 100 mg twice daily for 2 days and then go back to 50 mg twice daily.  I will repeat an echocardiogram as it has been several years since last echocardiogram.  Patient feels palpitations and he is aware that he is in atrial fibrillation, he will let me know if he remains in persistent atrial fibrillation so we can consider cardioversion.  Otherwise I will like to see him back in 6 months for follow-up.  4. Essential hypertension Blood pressure under excellent control no changes in the medications were done today.  5. Gastroesophageal reflux disease with esophagitis without hemorrhage He has tried pantoprazole, Pepcid, has severe GERD and probably esophagitis as he is having some difficulty with even eating anything without having discomfort.  In fact he has lost some weight as well.  Would like to try Dexilant.  May need repeat EGD.   Adrian Prows, MD, [facility] 06/24/2022, 11:24 AM Office: [phone]

## 2022-06-26 ENCOUNTER — Telehealth: Payer: Self-pay

## 2022-06-26 ENCOUNTER — Ambulatory Visit: Payer: Medicare Other | Admitting: Cardiology

## 2022-06-26 DIAGNOSIS — R7303 Prediabetes: Secondary | ICD-10-CM | POA: Diagnosis not present

## 2022-06-26 DIAGNOSIS — E7800 Pure hypercholesterolemia, unspecified: Secondary | ICD-10-CM | POA: Diagnosis not present

## 2022-06-26 DIAGNOSIS — D649 Anemia, unspecified: Secondary | ICD-10-CM | POA: Diagnosis not present

## 2022-06-26 DIAGNOSIS — E559 Vitamin D deficiency, unspecified: Secondary | ICD-10-CM | POA: Diagnosis not present

## 2022-06-26 DIAGNOSIS — I10 Essential (primary) hypertension: Secondary | ICD-10-CM | POA: Diagnosis not present

## 2022-06-26 DIAGNOSIS — K2100 Gastro-esophageal reflux disease with esophagitis, without bleeding: Secondary | ICD-10-CM

## 2022-06-26 NOTE — Telephone Encounter (Signed)
Patient is calling because the Rx you sent in for dexlansoprazole is too expensive and insurance will not cover it. He wants to know if there are other options.

## 2022-06-27 MED ORDER — NIZATIDINE 300 MG PO CAPS: 300.0000 mg | ORAL_CAPSULE | Freq: Two times a day (BID) | ORAL | 1 refills | Status: DC

## 2022-06-27 NOTE — Telephone Encounter (Signed)
ICD-10-CM   1. Gastroesophageal reflux disease with esophagitis without hemorrhage  K21.00 nizatidine (AXID) 300 MG capsule     Meds ordered this encounter  Medications   nizatidine (AXID) 300 MG capsule    Sig: Take 1 capsule (300 mg total) by mouth 2 (two) times daily.    Dispense:  60 capsule    Refill:  1    Medications Discontinued During This Encounter  Medication Reason   dexlansoprazole (DEXILANT) 60 MG capsule Cost of medication

## 2022-06-27 NOTE — Telephone Encounter (Signed)
Sent a new Rx to try to [facility] I think

## 2022-07-03 DIAGNOSIS — I4891 Unspecified atrial fibrillation: Secondary | ICD-10-CM | POA: Diagnosis not present

## 2022-07-03 DIAGNOSIS — I10 Essential (primary) hypertension: Secondary | ICD-10-CM | POA: Diagnosis not present

## 2022-07-03 DIAGNOSIS — I6529 Occlusion and stenosis of unspecified carotid artery: Secondary | ICD-10-CM | POA: Diagnosis not present

## 2022-07-03 DIAGNOSIS — E7800 Pure hypercholesterolemia, unspecified: Secondary | ICD-10-CM | POA: Diagnosis not present

## 2022-07-04 ENCOUNTER — Other Ambulatory Visit: Payer: Medicare Other

## 2022-07-10 ENCOUNTER — Other Ambulatory Visit: Payer: Self-pay | Admitting: Cardiology

## 2022-07-15 ENCOUNTER — Ambulatory Visit: Payer: Medicare Other

## 2022-07-15 DIAGNOSIS — I480 Paroxysmal atrial fibrillation: Secondary | ICD-10-CM

## 2022-07-24 ENCOUNTER — Other Ambulatory Visit: Payer: Self-pay | Admitting: Cardiology

## 2022-07-24 DIAGNOSIS — K2100 Gastro-esophageal reflux disease with esophagitis, without bleeding: Secondary | ICD-10-CM

## 2022-08-26 DIAGNOSIS — H43813 Vitreous degeneration, bilateral: Secondary | ICD-10-CM | POA: Diagnosis not present

## 2022-09-17 DIAGNOSIS — K219 Gastro-esophageal reflux disease without esophagitis: Secondary | ICD-10-CM | POA: Diagnosis not present

## 2022-09-26 ENCOUNTER — Other Ambulatory Visit: Payer: Self-pay

## 2022-09-26 DIAGNOSIS — E7800 Pure hypercholesterolemia, unspecified: Secondary | ICD-10-CM

## 2022-09-26 MED ORDER — EZETIMIBE 10 MG PO TABS: 10.0000 mg | ORAL_TABLET | Freq: Every day | ORAL | 1 refills | Status: DC

## 2022-12-22 ENCOUNTER — Ambulatory Visit: Payer: Medicare Other | Admitting: Cardiology

## 2022-12-27 ENCOUNTER — Other Ambulatory Visit: Payer: Self-pay | Admitting: Cardiology

## 2022-12-27 DIAGNOSIS — E7800 Pure hypercholesterolemia, unspecified: Secondary | ICD-10-CM

## 2023-01-12 ENCOUNTER — Encounter: Payer: Self-pay | Admitting: Cardiology

## 2023-01-12 ENCOUNTER — Ambulatory Visit: Payer: Medicare Other | Admitting: Cardiology

## 2023-01-12 VITALS — BP 100/61 | HR 58 | Resp 16 | Ht 72.0 in | Wt 184.6 lb

## 2023-01-12 DIAGNOSIS — I6523 Occlusion and stenosis of bilateral carotid arteries: Secondary | ICD-10-CM

## 2023-01-12 DIAGNOSIS — I10 Essential (primary) hypertension: Secondary | ICD-10-CM | POA: Diagnosis not present

## 2023-01-12 DIAGNOSIS — E559 Vitamin D deficiency, unspecified: Secondary | ICD-10-CM | POA: Diagnosis not present

## 2023-01-12 DIAGNOSIS — Z95828 Presence of other vascular implants and grafts: Secondary | ICD-10-CM | POA: Diagnosis not present

## 2023-01-12 DIAGNOSIS — E7800 Pure hypercholesterolemia, unspecified: Secondary | ICD-10-CM | POA: Diagnosis not present

## 2023-01-12 DIAGNOSIS — R7303 Prediabetes: Secondary | ICD-10-CM | POA: Diagnosis not present

## 2023-01-12 DIAGNOSIS — I480 Paroxysmal atrial fibrillation: Secondary | ICD-10-CM

## 2023-01-12 MED ORDER — DILTIAZEM HCL ER 120 MG PO CP24: 120.0000 mg | ORAL_CAPSULE | Freq: Two times a day (BID) | ORAL | 3 refills | Status: DC

## 2023-01-12 MED ORDER — FLECAINIDE ACETATE 50 MG PO TABS: 50.0000 mg | ORAL_TABLET | Freq: Two times a day (BID) | ORAL | 3 refills | Status: DC

## 2023-01-12 NOTE — Progress Notes (Signed)
Primary Physician/Referring:  Linus Galas, NP  Patient ID: Douglas Griffith, male    DOB: [birth year], 77 y.o.   MRN: [id number]  Chief Complaint  Patient presents with   Atrial Fibrillation   Hypertension   Follow-up    6 months   Douglas Griffith  is a 77 y.o. male  patient with  history of hypertension, hyperlipidemia, asymptomatic carotid stenosis and  paroxysmal atrial fibrillation and strong family history of premature coronary artery disease in father who had CAD in his 70s. underwent trans-carotid artery revascularization with flow reversal by Dr. Durene Cal on 08/10/2019.  He presents here for 68-month office visit, states that he has had occasional episodes of breakthrough atrial fibrillation for which he has used extra dose of flecainide occasionally.  Otherwise remains asymptomatic. Past Medical History:  Diagnosis Date   Asymptomatic bilateral carotid artery stenosis    Atrial flutter (HCC)    Dysrhythmia    PAF   Essential hypertension    GERD (gastroesophageal reflux disease)    Hypercholesteremia    Paroxysmal atrial fibrillation (HCC)     Past Surgical History:  Procedure Laterality Date   APPENDECTOMY  1960   EYE SURGERY Bilateral    cataracts   TRANSCAROTID ARTERY REVASCULARIZATION  Left 08/10/2019   Procedure: TRANSCAROTID ARTERY REVASCULARIZATION LEFT USING ENROUTE TRANSCAROTID STENT;  Surgeon: Nada Libman, MD;  Location: [facility] OR;  Service: Vascular;  Laterality: Left;   Social History   Tobacco Use   Smoking status: Former    Packs/day: 1.00    Years: 1.00    Additional pack years: 0.00    Total pack years: 1.00    Types: Cigarettes    Quit date: 07/19/1959    Years since quitting: 63.5   Smokeless tobacco: Never   Tobacco comments:    smoked as a teen  Substance Use Topics   Alcohol use: Not Currently    Alcohol/week: 7.0 standard drinks of alcohol    Types: 7 Glasses of wine per week    Comment: Down to one glass of one    Family  History  Problem Relation Age of Onset   CAD Father        died of MI at age 46   CAD Brother     Review of Systems  Cardiovascular:  Positive for irregular heartbeat. Negative for chest pain, dyspnea on exertion and leg swelling.  Gastrointestinal:  Negative for melena.   Objective  Blood pressure 100/61, pulse (!) 58, resp. rate 16, height 6' (1.829 m), weight 184 lb 9.6 oz (83.7 kg), SpO2 95 %. Body mass index is 25.04 kg/m.      01/12/2023   11:16 AM 06/24/2022   10:47 AM 06/20/2021   10:28 AM  Vitals with BMI  Height 6\' 0"  6\' 0"  6\' 0"   Weight 184 lbs 10 oz 182 lbs 189 lbs 13 oz  BMI 25.03 24.68 25.74  Systolic 100 131 409  Diastolic 61 67 58  Pulse 58 57 56   Physical Exam Constitutional:      General: He is not in acute distress.    Appearance: He is well-developed.  HENT:     Head: Atraumatic.  Eyes:     Conjunctiva/sclera: Conjunctivae normal.  Neck:     Vascular: Carotid bruit (right) present. No JVD.  Cardiovascular:     Rate and Rhythm: Normal rate. Rhythm irregularly irregular.     Pulses: Normal pulses and intact distal pulses.  Carotid pulses are  on the right side with bruit.    Heart sounds: Normal heart sounds. No murmur heard.    No gallop.  Pulmonary:     Effort: Pulmonary effort is normal.     Breath sounds: Normal breath sounds.  Abdominal:     General: Bowel sounds are normal.     Palpations: Abdomen is soft.  Musculoskeletal:     Right lower leg: No edema.     Left lower leg: No edema.    Laboratory examination:   External labs:  Labs 06/29/2022:  A1c 5.5%.  Total cholesterol 154, triglycerides 97, HDL 50, LDL 85.  TSH normal.  Vitamin D30.4.  Hb 12.6/HCT 39.3, platelets 283, normal indicis.  Sodium 138, potassium 4.0, BUN 24, creatinine 1.06, EGFR 66,, LFTs normal.  Medications    Current Outpatient Medications:    ezetimibe (ZETIA) 10 MG tablet, Take 1 tablet (10 mg total) by mouth daily., Disp: 90 tablet, Rfl:  1   famotidine (PEPCID) 40 MG tablet, Take 40 mg by mouth daily., Disp: , Rfl:    nizatidine (AXID) 300 MG capsule, TAKE 1 CAPSULE BY MOUTH TWICE A DAY, Disp: 180 capsule, Rfl: 3   rosuvastatin (CRESTOR) 20 MG tablet, TAKE 1 TABLET BY MOUTH EVERY DAY, Disp: 90 tablet, Rfl: 1   valsartan-hydrochlorothiazide (DIOVAN-HCT) 160-12.5 MG tablet, TAKE 1 TABLET BY MOUTH EVERY DAY (Patient taking differently: 0.5 tablets.), Disp: 90 tablet, Rfl: 1   XARELTO 20 MG TABS tablet, TAKE 1 TABLET BY MOUTH DAILY WITH SUPPER., Disp: 90 tablet, Rfl: 3   diltiazem (DILT-XR) 120 MG 24 hr capsule, Take 1 capsule (120 mg total) by mouth 2 (two) times daily., Disp: 90 capsule, Rfl: 3   flecainide (TAMBOCOR) 50 MG tablet, Take 1 tablet (50 mg total) by mouth 2 (two) times daily. Take additional for break through AF, Disp: 240 tablet, Rfl: 3    Radiology: CTA neck 08/01/2019: 1. Critical, flow-limiting stenosis proximal left internal carotid artery due to atherosclerotic disease. Estimated 90% diameter stenosis proximal left internal carotid artery 2. Atherosclerotic disease right carotid bifurcation with less than 25% diameter stenosis right internal carotid artery. 3. Moderate stenosis origin of left vertebral artery. Right vertebral artery widely patent without stenosis.  Cardiac Studies:   Exercise sestamibi 2012: Exercise duration 12 min, 15 METs. Diaphragmatic attenuation. No ischemia. EF 64%. low risk.  Event Monitor 06/16/12 through 07/15/2012: Paroxysmal episodes of A. Fibrillation and A. Flutter, asymptomatic. Mostly rate controlled.  08/10/2019: Left transcarotid artery revascularization with stent placement (TCAR)     Carotid artery duplex 06/20/2022: Duplex suggests stenosis in the right internal carotid artery (16-49%). There is a patent stent placed in the left carotid from the ICA-prox to the ICA-mid.  Antegrade right vertebral artery flow. Antegrade left vertebral artery flow. Compared to the study  done on 06/12/2021, no significant change. Follow up in one year is appropriate if clinically indicated.  [facility] ECHOCARDIOGRAM COMPLETE 07/15/2022  Narrative Echocardiogram 07/15/2022: Normal LV systolic function with EF 69%. Left ventricle cavity is normal in size. Normal left ventricular wall thickness. Normal global wall motion. Normal diastolic filling pattern. Calculated EF 69%. Patient in sinus rhythm during exam. Trileaflet aortic valve with no regurgitation. Mild aortic valve leaflet calcification. Normal aortic valve leaflet mobility. No evidence of aortic valve stenosis. Compared to 06/12/2011, no significant change.   EKG:  EKG 01/12/2023: Sinus bradycardia at 53 bpm, 1st Degree AV block, left atrial enlargement, otherwise normal EKG. Compared to 06/24/2022, paroxysmal atrial fibrillation  not present.  Borderline first-degree AV block is new.  Assessment     ICD-10-CM   1. Paroxysmal atrial fibrillation (HCC)  I48.0 EKG 12-Lead    flecainide (TAMBOCOR) 50 MG tablet    diltiazem (DILT-XR) 120 MG 24 hr capsule    2. Presence of internal carotid stent: left ICA TCAR 08/10/2019  Z95.828     3. Asymptomatic bilateral carotid artery stenosis  I65.23     4. Hypercholesteremia  E78.00      CHA2DS2-VASc Score is 4.  Yearly risk of stroke: 4.8% (A, HTN, Vasc Dz).  Score of 1=0.6; 2=2.2; 3=3.2; 4=4.8; 5=7.2; 6=9.8; 7=>9.8) -(CHF; HTN; vasc disease DM,  Male = 1; Age <65 =0; 65-74 = 1,  >75 =2; stroke/embolism= 2).   Recommendations:    Douglas Griffith  is a 77 y.o. Caucasian male patient with  history of hypertension, hyperlipidemia, asymptomatic carotid stenosis and  paroxysmal atrial fibrillation and strong family history of premature coronary artery disease in father who had CAD in his 30s. underwent trans-carotid artery revascularization with flow reversal by Dr. Durene Cal on 08/10/2019.   1. Paroxysmal atrial fibrillation [facility]) Patient is presently doing well and mostly  maintaining sinus rhythm with occasional breakthrough atrial fibrillation for which she takes 1 extra dose of flecainide 50 mg.  Continue the same for now.  In view of soft blood pressure, he has reduced the dose of valsartan HCT from 160/12.5 mg to 1/2 tablet daily, advised him to continue the same but also I will reduce the dose of diltiazem CD from 180 mg to 120 mg daily.  I reviewed his external labs, labs are within normal limits.  Renal function is normal.  No bleeding diathesis on Xarelto.  - EKG 12-Lead - flecainide (TAMBOCOR) 50 MG tablet; Take 1 tablet (50 mg total) by mouth 2 (two) times daily. Take additional for break through AF  Dispense: 240 tablet; Refill: 3 - diltiazem (DILT-XR) 120 MG 24 hr capsule; Take 1 capsule (120 mg total) by mouth 2 (two) times daily.  Dispense: 90 capsule; Refill: 3  2. Presence of internal carotid stent: left ICA TCAR 08/10/2019 Patient has mild asymptomatic right carotid stenosis and left TCAR, he has been scheduled for carotid artery duplex in 6 months and I would like to see him back after that.  3. Asymptomatic bilateral carotid artery stenosis Patient is presently doing well and remains asymptomatic.  Carotid duplex scheduled for December and I will see him back after that.    4. Hypercholesteremia  He has had blood work drawn this morning, LDL goal is <70 in view of carotid disease, I will forward this note to his PCP.  If LDL is >70, could consider either switching to Lipitor 40 mg or increasing Crestor to 40 mg daily along with continued Zetia.    Yates Decamp, MD, [facility] 01/12/2023, 4:55 PM Office: [phone]

## 2023-01-19 DIAGNOSIS — Z0000 Encounter for general adult medical examination without abnormal findings: Secondary | ICD-10-CM | POA: Diagnosis not present

## 2023-01-19 DIAGNOSIS — E7800 Pure hypercholesterolemia, unspecified: Secondary | ICD-10-CM | POA: Diagnosis not present

## 2023-01-19 DIAGNOSIS — I10 Essential (primary) hypertension: Secondary | ICD-10-CM | POA: Diagnosis not present

## 2023-01-19 DIAGNOSIS — I4891 Unspecified atrial fibrillation: Secondary | ICD-10-CM | POA: Diagnosis not present

## 2023-01-19 DIAGNOSIS — I6529 Occlusion and stenosis of unspecified carotid artery: Secondary | ICD-10-CM | POA: Diagnosis not present

## 2023-01-21 ENCOUNTER — Encounter: Payer: Self-pay | Admitting: Cardiology

## 2023-01-21 NOTE — Progress Notes (Signed)
Labs 01/10/2023:  Hb 13.8/HCT 43.4, platelets 327.  Serum glucose 106 mg, BUN 19, creatinine 0.97, EGFR 79 mL, potassium 4.1, sodium 138, LFTs normal. TSH normal at 1.60. A1c 5.6%.  Total cholesterol 158, triglycerides 106, HDL 45, LDL 83.

## 2023-03-17 ENCOUNTER — Other Ambulatory Visit: Payer: Self-pay | Admitting: Cardiology

## 2023-03-17 DIAGNOSIS — I480 Paroxysmal atrial fibrillation: Secondary | ICD-10-CM

## 2023-03-18 ENCOUNTER — Encounter: Payer: Self-pay | Admitting: Cardiology

## 2023-03-24 NOTE — Telephone Encounter (Signed)
Please advise 

## 2023-04-16 DIAGNOSIS — Z7901 Long term (current) use of anticoagulants: Secondary | ICD-10-CM | POA: Diagnosis not present

## 2023-04-16 DIAGNOSIS — K219 Gastro-esophageal reflux disease without esophagitis: Secondary | ICD-10-CM | POA: Diagnosis not present

## 2023-04-16 DIAGNOSIS — Z860100 Personal history of colon polyps, unspecified: Secondary | ICD-10-CM | POA: Diagnosis not present

## 2023-04-16 DIAGNOSIS — I4891 Unspecified atrial fibrillation: Secondary | ICD-10-CM | POA: Diagnosis not present

## 2023-06-05 ENCOUNTER — Encounter: Payer: Self-pay | Admitting: Cardiology

## 2023-06-05 DIAGNOSIS — E7800 Pure hypercholesterolemia, unspecified: Secondary | ICD-10-CM

## 2023-06-08 MED ORDER — EZETIMIBE 10 MG PO TABS
10.0000 mg | ORAL_TABLET | Freq: Every day | ORAL | 0 refills | Status: DC
Start: 2023-06-08 — End: 2023-09-09

## 2023-06-16 ENCOUNTER — Telehealth: Payer: Self-pay | Admitting: Cardiology

## 2023-06-16 NOTE — Telephone Encounter (Signed)
Patient notified.  He is aware to keep carotid doppler appointment as scheduled

## 2023-06-16 NOTE — Telephone Encounter (Signed)
Reviewed with Dr Jacinto Halim and patient does not need an echo at this time.  Does need carotid doppler as scheduled.  I placed call to patient and left message to call office

## 2023-06-16 NOTE — Telephone Encounter (Signed)
Call to patient to verify that he has new echo scheduled at [address] office for 06/18/23. Also verified time, date, location of carotid scan scheduled for same day at [facility] office. Patient verbalizes understanding and agrees to keep this appointments.

## 2023-06-16 NOTE — Telephone Encounter (Signed)
Patient was scheduled to have an [facility] at [facility]. The appointment was cancelled due to [facility] closing. We will need a new order placed for the patient to have an echo. Please advise.

## 2023-06-18 ENCOUNTER — Ambulatory Visit (HOSPITAL_COMMUNITY)
Admission: RE | Admit: 2023-06-18 | Discharge: 2023-06-18 | Disposition: A | Discharge status: Home / Self Care | Payer: Medicare Other | Source: Ambulatory Visit | Attending: Cardiology | Admitting: Cardiology

## 2023-06-18 ENCOUNTER — Ambulatory Visit (HOSPITAL_COMMUNITY): Payer: Self-pay

## 2023-06-18 DIAGNOSIS — I6523 Occlusion and stenosis of bilateral carotid arteries: Secondary | ICD-10-CM

## 2023-06-18 DIAGNOSIS — Z95828 Presence of other vascular implants and grafts: Secondary | ICD-10-CM

## 2023-06-23 ENCOUNTER — Encounter: Payer: Self-pay | Admitting: Cardiology

## 2023-06-23 ENCOUNTER — Ambulatory Visit: Payer: Medicare Other | Attending: Cardiology | Admitting: Cardiology

## 2023-06-23 VITALS — BP 134/64 | HR 57 | Resp 16 | Ht 72.0 in | Wt 187.2 lb

## 2023-06-23 DIAGNOSIS — I6523 Occlusion and stenosis of bilateral carotid arteries: Secondary | ICD-10-CM | POA: Diagnosis not present

## 2023-06-23 DIAGNOSIS — Z95828 Presence of other vascular implants and grafts: Secondary | ICD-10-CM | POA: Diagnosis not present

## 2023-06-23 DIAGNOSIS — I480 Paroxysmal atrial fibrillation: Secondary | ICD-10-CM | POA: Diagnosis not present

## 2023-06-23 DIAGNOSIS — I10 Essential (primary) hypertension: Secondary | ICD-10-CM

## 2023-06-23 NOTE — Patient Instructions (Signed)
 Medication Instructions:  Your physician recommends that you continue on your current medications as directed. Please refer to the Current Medication list given to you today.  *If you need a refill on your cardiac medications before your next appointment, please call your pharmacy*   Lab Work: none If you have labs (blood work) drawn today and your tests are completely normal, you will receive your results only by: MyChart Message (if you have MyChart) OR A paper copy in the mail If you have any lab test that is abnormal or we need to change your treatment, we will call you to review the results.   Testing/Procedures: Your physician has requested that you have a carotid duplex.To be done in one year This test is an ultrasound of the carotid arteries in your neck. It looks at blood flow through these arteries that supply the brain with blood. Allow one hour for this exam. There are no restrictions or special instructions.    Follow-Up: At [facility], you and your health needs are our priority.  As part of our continuing mission to provide you with exceptional heart care, we have created designated Provider Care Teams.  These Care Teams include your primary Cardiologist (physician) and Advanced Practice Providers (APPs -  Physician Assistants and Nurse Practitioners) who all work together to provide you with the care you need, when you need it.  We recommend signing up for the patient portal called "MyChart".  Sign up information is provided on this After Visit Summary.  MyChart is used to connect with patients for Virtual Visits (Telemedicine).  Patients are able to view lab/test results, encounter notes, upcoming appointments, etc.  Non-urgent messages can be sent to your provider as well.   To learn more about what you can do with MyChart, go to [email or web address].    Your next appointment:   12 month(s)  Provider:   Yates Decamp, MD     Other Instructions

## 2023-06-23 NOTE — Progress Notes (Signed)
Cardiology Office Note:  .   Date:  06/23/2023  ID:  Douglas Griffith, DOB 15-Sep-1945, MRN [id number] PCP: Linus Galas, NP  [facility] Providers Cardiologist:  Yates Decamp, MD   History of Present Illness: .   Douglas Griffith is a 77 y.o. male  patient with  history of hypertension, hyperlipidemia, asymptomatic carotid stenosis and  paroxysmal atrial fibrillation and strong family history of premature coronary artery disease in father who had CAD in his 81s. underwent trans-carotid artery revascularization with flow reversal by Dr. Durene Cal on 08/10/2019.   He presents here for 29-month office visit, states that he has had occasional episodes of breakthrough atrial fibrillation for which he has used extra dose of flecainide occasionally.  Otherwise remains asymptomatic.  Discussed the use of AI scribe software for clinical note transcription with the patient, who gave verbal consent to proceed.  History of Present Illness   The patient, with a history of atrial fibrillation (AFib) and a stent in his carotid artery, reports feeling generally well. He does not believe he has had any recent episodes of AFib, although he occasionally notices his heartbeat becoming irregular. When this happens, he takes an extra dose of his medication as a precaution. He exercises regularly, including light weightlifting, which he believes has helped him maintain his weight. He also reports having a sensitive stomach, which has been managed with a combination of medications. The patient experiences occasional bleeding due to hemorrhoids, which he manages by temporarily stopping his blood thinners. He also uses a stool softener as needed for constipation. The patient's wife is in poor health, which causes him some stress.      Review of Systems  Cardiovascular:  Positive for palpitations. Negative for chest pain, dyspnea on exertion and leg swelling.    Labs   External Labs:  Labs 01/10/2023:   Hb  13.8/HCT 43.4, platelets 327.   Serum glucose 106 mg, BUN 19, creatinine 0.97, EGFR 79 mL, potassium 4.1, sodium 138, LFTs normal. TSH normal at 1.60. A1c 5.6%.   Total cholesterol 158, triglycerides 106, HDL 45, LDL 83.  Physical Exam:   VS:  BP 134/64 (BP Location: Right Arm, Patient Position: Sitting, Cuff Size: Normal)   Pulse (!) 57   Resp 16   Ht 6' (1.829 m)   Wt 187 lb 3.2 oz (84.9 kg)   SpO2 97%   BMI 25.39 kg/m    Wt Readings from Last 3 Encounters:  06/23/23 187 lb 3.2 oz (84.9 kg)  01/12/23 184 lb 9.6 oz (83.7 kg)  06/24/22 182 lb (82.6 kg)     Physical Exam Neck:     Vascular: Carotid bruit (right) present. No JVD.  Cardiovascular:     Rate and Rhythm: Normal rate and regular rhythm.     Pulses: Intact distal pulses.     Heart sounds: Normal heart sounds. No murmur heard.    No gallop.  Pulmonary:     Effort: Pulmonary effort is normal.     Breath sounds: Normal breath sounds.  Abdominal:     General: Bowel sounds are normal.     Palpations: Abdomen is soft.  Musculoskeletal:     Right lower leg: No edema.     Left lower leg: No edema.    Studies Reviewed: .    Echocardiogram 07/15/2022: Normal LV systolic function with EF 69%. Left ventricle cavity is normal in size. Normal left ventricular wall thickness. Normal global wall motion. Normal diastolic filling pattern. Calculated  EF 69%. Patient in sinus rhythm during exam. Trileaflet aortic valve with no regurgitation. Mild aortic valve leaflet calcification. Normal aortic valve leaflet mobility. No evidence of aortic valve stenosis. Compared to 06/12/2011, no significant change.  Carotid artery duplex 06/18/2023: Right Carotid: Velocities in the right ICA are consistent with a 1-39%  stenosis.  No significant change from 06/20/2022.  Left Carotid: Patent left ICA stent without evidence of hemodynamically significant stenosis.  Vertebrals:  Bilateral vertebral arteries demonstrate antegrade flow.   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  Peak velocity right 259 cm/s and left 262 cm/s with bilateral antegrade vertebral artery flow.  EKG:    EKG Interpretation Date/Time:  Tuesday June 23 2023 10:05:43 EST Ventricular Rate:  56 PR Interval:  208 QRS Duration:  98 QT Interval:  436 QTC Calculation: 420 R Axis:   46  Text Interpretation: EKG 06/23/2023: Sinus bradycardia at the rate of 56 bpm, normal axis, poor R progression, probably normal variant.  No evidence of ischemia, otherwise normal EKG.  No significant change from 01/12/2023, borderline first-degree AV block not present. Confirmed by Delrae Rend [phone]) on 06/23/2023 10:13:43 AM    Medications and allergies    Allergies  Allergen Reactions   Amoxicillin Other (See Comments)     Current Outpatient Medications:    DILT-XR 120 MG 24 hr capsule, TAKE 1 CAPSULE BY MOUTH 2 TIMES DAILY., Disp: 180 capsule, Rfl: 1   ezetimibe (ZETIA) 10 MG tablet, Take 1 tablet (10 mg total) by mouth daily., Disp: 90 tablet, Rfl: 0   famotidine (PEPCID) 40 MG tablet, Take 40 mg by mouth daily., Disp: , Rfl:    flecainide (TAMBOCOR) 50 MG tablet, Take 1 tablet (50 mg total) by mouth 2 (two) times daily. Take additional for break through AF, Disp: 240 tablet, Rfl: 3   omeprazole (PRILOSEC) 40 MG capsule, Take 40 mg by mouth every morning., Disp: , Rfl:    rosuvastatin (CRESTOR) 20 MG tablet, TAKE 1 TABLET BY MOUTH EVERY DAY, Disp: 90 tablet, Rfl: 1   XARELTO 20 MG TABS tablet, TAKE 1 TABLET BY MOUTH DAILY WITH SUPPER., Disp: 90 tablet, Rfl: 3   ASSESSMENT AND PLAN: .      ICD-10-CM   1. Paroxysmal atrial fibrillation (HCC)  I48.0 EKG 12-Lead    2. Asymptomatic bilateral carotid artery stenosis  I65.23 VAS US CAROTID    3. Presence of internal left carotid stent: TCAR 08/10/2019  Z95.828 VAS US CAROTID    4. Essential hypertension  I10      Click Here to Calculate/Change CHADS2VASc Score The patient's  CHADS2-VASc score is 4, indicating a 4.8% annual risk of stroke.  Therefore, anticoagulation is recommended.    Assessment and Plan    Atrial Fibrillation Stable with occasional episodes. Patient self-monitors and adjusts Eliquis dose as needed. -Continue current management strategy.  Carotid Artery Stenosis Recent carotid artery duplex showed stent is wide open and no significant blockage. -Repeat carotid artery duplex in 1 year.  Hemorrhoids Occasional bleeding episodes, managed by temporarily holding Eliquis. -Continue current management strategy. -Consider daily Metamucil to promote regular bowel movements and potentially reduce hemorrhoid flare-ups.  Hypertension Well controlled, no longer on Valsartan HCT. Presently on Dilt XR 120 mg BID -Continue current management strategy.  Follow-up -Return visit in 1 year. -Complete full blood test with Dr. Arlana Pouch in 30 days.      Signed,  Yates Decamp, MD, [facility] 06/23/2023, 9:21 PM [facility] [address] [place], [place]  [address] Phone: [phone]. Fax:  [phone]

## 2023-06-25 ENCOUNTER — Other Ambulatory Visit: Payer: Medicare Other

## 2023-06-25 ENCOUNTER — Ambulatory Visit: Payer: Medicare Other | Admitting: Physician Assistant

## 2023-07-02 ENCOUNTER — Ambulatory Visit: Payer: Self-pay | Admitting: Cardiology

## 2023-07-09 DIAGNOSIS — E559 Vitamin D deficiency, unspecified: Secondary | ICD-10-CM | POA: Diagnosis not present

## 2023-07-09 DIAGNOSIS — E7800 Pure hypercholesterolemia, unspecified: Secondary | ICD-10-CM | POA: Diagnosis not present

## 2023-07-09 DIAGNOSIS — R7303 Prediabetes: Secondary | ICD-10-CM | POA: Diagnosis not present

## 2023-07-09 DIAGNOSIS — D649 Anemia, unspecified: Secondary | ICD-10-CM | POA: Diagnosis not present

## 2023-07-10 LAB — LAB REPORT - SCANNED
A1c: 5.6
EGFR: 87

## 2023-07-13 ENCOUNTER — Other Ambulatory Visit: Payer: Self-pay

## 2023-07-13 DIAGNOSIS — E7800 Pure hypercholesterolemia, unspecified: Secondary | ICD-10-CM

## 2023-07-13 MED ORDER — ROSUVASTATIN CALCIUM 20 MG PO TABS
20.0000 mg | ORAL_TABLET | Freq: Every day | ORAL | 3 refills | Status: AC
Start: 2023-07-13 — End: ?

## 2023-07-16 DIAGNOSIS — E7800 Pure hypercholesterolemia, unspecified: Secondary | ICD-10-CM | POA: Diagnosis not present

## 2023-07-16 DIAGNOSIS — I4891 Unspecified atrial fibrillation: Secondary | ICD-10-CM | POA: Diagnosis not present

## 2023-07-16 DIAGNOSIS — Z23 Encounter for immunization: Secondary | ICD-10-CM | POA: Diagnosis not present

## 2023-07-16 DIAGNOSIS — I6529 Occlusion and stenosis of unspecified carotid artery: Secondary | ICD-10-CM | POA: Diagnosis not present

## 2023-07-16 DIAGNOSIS — I10 Essential (primary) hypertension: Secondary | ICD-10-CM | POA: Diagnosis not present

## 2023-09-07 ENCOUNTER — Other Ambulatory Visit: Payer: Self-pay | Admitting: Cardiology

## 2023-09-07 ENCOUNTER — Encounter: Payer: Self-pay | Admitting: Cardiology

## 2023-09-07 DIAGNOSIS — E7800 Pure hypercholesterolemia, unspecified: Secondary | ICD-10-CM

## 2023-09-08 ENCOUNTER — Other Ambulatory Visit: Payer: Self-pay

## 2023-09-08 MED ORDER — RIVAROXABAN 20 MG PO TABS: 20.0000 mg | ORAL_TABLET | Freq: Every day | ORAL | 3 refills | Status: AC

## 2023-10-12 ENCOUNTER — Telehealth: Payer: Self-pay | Admitting: *Deleted

## 2023-10-12 NOTE — Telephone Encounter (Signed)
   Pre-operative Risk Assessment    Patient Name: Douglas Griffith  DOB: Sep 06, 1945 MRN: [id number]   Date of last office visit: 06/23/23 DR. Jacinto Halim Date of next office visit: NONE   Request for Surgical Clearance    Procedure:   COLONOSCOPY  Date of Surgery:  Clearance TBD (REQUEST SAYS STAT)                               Surgeon:  DR. Liana Gerold Surgeon's Group or Practice Name:  [facility]  Phone number:  [phone] Fax number:  [phone]   Type of Clearance Requested:   - Medical  - Pharmacy:  Hold Rivaroxaban (Xarelto) x 2 DAYS PRIOR   Type of Anesthesia:  Not Indicated   Additional requests/questions:    Elpidio Anis   10/12/2023, 6:18 PM

## 2023-10-14 ENCOUNTER — Ambulatory Visit: Attending: Cardiovascular Disease | Admitting: Emergency Medicine

## 2023-10-14 ENCOUNTER — Telehealth: Payer: Self-pay | Admitting: *Deleted

## 2023-10-14 NOTE — Telephone Encounter (Signed)
 Patient scheduled 10-14-23 @ 1:20pm

## 2023-10-14 NOTE — Progress Notes (Signed)
 Virtual Visit via Telephone Note   Because of Douglas Griffith co-morbid illnesses, he is at least at moderate risk for complications without adequate follow up.  This format is felt to be most appropriate for this patient at this time.  Due to technical limitations with video connection (technology), today's appointment will be conducted as an audio only telehealth visit, and Douglas Griffith verbally agreed to proceed in this manner.   All issues noted in this document were discussed and addressed.  No physical exam could be performed with this format.  Evaluation Performed:  Preoperative cardiovascular risk assessment _____________   Date:  10/14/2023   Patient ID:  Douglas Griffith, DOB 10-02-1945, MRN [id number] Patient Location:  Home Provider location:   Office  Primary Care Provider:  Linus Galas, NP Primary Cardiologist:  Yates Decamp, MD  Chief Complaint / Patient Profile   78 y.o. y/o male with a h/o hypertension, hyperlipidemia, carotid artery stenosis, hemorrhoids, paroxysmal atrial fibrillation who is pending colonoscopy on date TBD with [facility] gastroenterology by Dr. Liana Gerold and presents today for telephonic preoperative cardiovascular risk assessment.  History of Present Illness    Douglas Griffith is a 78 y.o. male who presents via audio/video conferencing for a telehealth visit today.  Pt was last seen in cardiology clinic on 06/23/2023 by Dr. Jacinto Halim.  At that time Douglas Griffith was doing well.  The patient is now pending procedure as outlined above. Since his last visit, he denies chest pain, shortness of breath, lower extremity edema, fatigue, palpitations, melena, hematuria, hemoptysis, diaphoresis, weakness, presyncope, syncope, orthopnea, and PND.  Today patient is without acute cardiovascular concerns or complaints.  He notes he has been well overall since his last office visit.  He remains very active and walks at least 10,000 steps a day and goes to  the [facility] at least 3 times a week.  He denies any anginal symptoms on exertion.  Past Medical History    Past Medical History:  Diagnosis Date   Asymptomatic bilateral carotid artery stenosis    Atrial flutter (HCC)    Dysrhythmia    PAF   Essential hypertension    GERD (gastroesophageal reflux disease)    Hypercholesteremia    Paroxysmal atrial fibrillation (HCC)    Past Surgical History:  Procedure Laterality Date   APPENDECTOMY  1960   EYE SURGERY Bilateral    cataracts   TRANSCAROTID ARTERY REVASCULARIZATION  Left 08/10/2019   Procedure: TRANSCAROTID ARTERY REVASCULARIZATION LEFT USING ENROUTE TRANSCAROTID STENT;  Surgeon: Nada Libman, MD;  Location[facility] OR;  Service: Vascular;  Laterality: Left;    Allergies  Allergies  Allergen Reactions   Amoxicillin Other (See Comments)    Home Medications    Prior to Admission medications   Medication Sig Start Date End Date Taking? Authorizing Provider  DILT-XR 120 MG 24 hr capsule TAKE 1 CAPSULE BY MOUTH 2 TIMES DAILY. 03/19/23   Yates Decamp, MD  ezetimibe (ZETIA) 10 MG tablet TAKE 1 TABLET BY MOUTH EVERY DAY 09/09/23   Yates Decamp, MD  famotidine (PEPCID) 40 MG tablet Take 40 mg by mouth daily. 06/09/22   [provider]  flecainide (TAMBOCOR) 50 MG tablet Take 1 tablet (50 mg total) by mouth 2 (two) times daily. Take additional for break through AF 01/12/23   Yates Decamp, MD  omeprazole (PRILOSEC) 40 MG capsule Take 40 mg by mouth every morning. 06/12/23   [provider]  rivaroxaban (XARELTO) 20 MG TABS tablet  Take 1 tablet (20 mg total) by mouth daily with supper. 09/08/23   Yates Decamp, MD  rosuvastatin (CRESTOR) 20 MG tablet Take 1 tablet (20 mg total) by mouth daily. 07/13/23   Yates Decamp, MD    Physical Exam    Vital Signs:  Liam Graham does not have vital signs available for review today.  Given telephonic nature of communication, physical exam is limited. AAOx3. NAD. Normal affect.  Speech and  respirations are unlabored.  Accessory Clinical Findings    None  Assessment & Plan    1.  Preoperative Cardiovascular Risk Assessment: According to the Revised Cardiac Risk Index (RCRI), his Perioperative Risk of Major Cardiac Event is (%): 0.4. His Functional Capacity in METs is: 8.27 according to the Duke Activity Status Index (DASI). Therefore, based on ACC/AHA guidelines, patient would be at acceptable risk for the planned procedure without further cardiovascular testing.   The patient was advised that if he develops new symptoms prior to surgery to contact our office to arrange for a follow-up visit, and he verbalized understanding.  Per office protocol, patient can hold Xarelto for 2 days prior to procedure.   Patient will not need bridging with Lovenox (enoxaparin) around procedure.  A copy of this note will be routed to requesting surgeon.  Time:   Today, I have spent 6 minutes with the patient with telehealth technology discussing medical history, symptoms, and management plan.     Denyce Robert, NP  10/14/2023, 1:19 PM

## 2023-10-14 NOTE — Telephone Encounter (Signed)
   Name: Douglas Griffith  DOB: 1945-12-25  MRN: [id number]  Primary Cardiologist: Yates Decamp, MD   Preoperative team, please contact this patient and set up a phone call appointment for further preoperative risk assessment. Please obtain consent and complete medication review. Thank you for your help.  I confirm that guidance regarding antiplatelet and oral anticoagulation therapy has been completed and, if necessary, noted below.  Per office protocol, patient can hold Xarelto for 2 days prior to procedure.   Patient will not need bridging with Lovenox (enoxaparin) around procedure.  I also confirmed the patient resides in the state of [place]. As per [facility] Medical Board telemedicine laws, the patient must reside in the state in which the provider is licensed.   Ronney Asters, NP 10/14/2023, 8:55 AM [facility]

## 2023-10-14 NOTE — Telephone Encounter (Signed)
 Patient with diagnosis of atrial fibrillation on Xarelto for anticoagulation.    Procedure:   COLONOSCOPY   Date of Surgery:  Clearance TBD (REQUEST SAYS STAT)      CHA2DS2-VASc Score = 4   This indicates a 4.8% annual risk of stroke. The patient's score is based upon: CHF History: 0 HTN History: 1 Diabetes History: 0 Stroke History: 0 Vascular Disease History: 1 Age Score: 2 Gender Score: 0    CrCl 79 Platelet count 291  Per office protocol, patient can hold Xarelto for 2 days prior to procedure.   Patient will not need bridging with Lovenox (enoxaparin) around procedure.  **This guidance is not considered finalized until pre-operative APP has relayed final recommendations.**

## 2023-10-14 NOTE — Telephone Encounter (Signed)
  Patient Consent for Virtual Visit   Douglas Griffith has provided verbal consent on 10/14/2023 for a virtual visit (video or telephone).  CONSENT FOR VIRTUAL VISIT FOR:  Douglas Griffith  By participating in this virtual visit I agree to the following:  I hereby voluntarily request, consent and authorize [facility] and its employed or contracted physicians, physician assistants, nurse practitioners or other licensed health care professionals (the Practitioner), to provide me with telemedicine health care services (the "Services") as deemed necessary by the treating Practitioner. I acknowledge and consent to receive the Services by the Practitioner via telemedicine. I understand that the telemedicine visit will involve communicating with the Practitioner through live audiovisual communication technology and the disclosure of certain medical information by electronic transmission. I acknowledge that I have been given the opportunity to request an in-person assessment or other available alternative prior to the telemedicine visit and am voluntarily participating in the telemedicine visit.  I understand that I have the right to withhold or withdraw my consent to the use of telemedicine in the course of my care at any time, without affecting my right to future care or treatment, and that the Practitioner or I may terminate the telemedicine visit at any time. I understand that I have the right to inspect all information obtained and/or recorded in the course of the telemedicine visit and may receive copies of available information for a reasonable fee.  I understand that some of the potential risks of receiving the Services via telemedicine include:  Delay or interruption in medical evaluation due to technological equipment failure or disruption; Information transmitted may not be sufficient (e.g. poor resolution of images) to allow for appropriate medical decision making by the Practitioner;  and/or  In rare instances, security protocols could fail, causing a breach of personal health information.  Furthermore, I acknowledge that it is my responsibility to provide information about my medical history, conditions and care that is complete and accurate to the best of my ability. I acknowledge that Practitioner's advice, recommendations, and/or decision may be based on factors not within their control, such as incomplete or inaccurate data provided by me or distortions of diagnostic images or specimens that may result from electronic transmissions. I understand that the practice of medicine is not an exact science and that Practitioner makes no warranties or guarantees regarding treatment outcomes. I acknowledge that a copy of this consent can be made available to me via my patient portal [facility] MyChart), or I can request a printed copy by calling the office of [facility].    I understand that my insurance will be billed for this visit.   I have read or had this consent read to me. I understand the contents of this consent, which adequately explains the benefits and risks of the Services being provided via telemedicine.  I have been provided ample opportunity to ask questions regarding this consent and the Services and have had my questions answered to my satisfaction. I give my informed consent for the services to be provided through the use of telemedicine in my medical care

## 2023-11-19 DIAGNOSIS — Z860100 Personal history of colon polyps, unspecified: Secondary | ICD-10-CM | POA: Diagnosis not present

## 2023-11-19 DIAGNOSIS — K635 Polyp of colon: Secondary | ICD-10-CM | POA: Diagnosis not present

## 2023-11-19 DIAGNOSIS — Z1211 Encounter for screening for malignant neoplasm of colon: Secondary | ICD-10-CM | POA: Diagnosis not present

## 2023-12-08 ENCOUNTER — Other Ambulatory Visit: Payer: Self-pay

## 2023-12-08 DIAGNOSIS — I480 Paroxysmal atrial fibrillation: Secondary | ICD-10-CM

## 2023-12-08 MED ORDER — DILTIAZEM HCL ER 120 MG PO CP24: 120.0000 mg | ORAL_CAPSULE | Freq: Two times a day (BID) | ORAL | 1 refills | Status: DC

## 2023-12-08 MED ORDER — DILT-XR 120 MG PO CP24: 120.0000 mg | ORAL_CAPSULE | Freq: Two times a day (BID) | ORAL | 1 refills | Status: AC

## 2024-01-18 DIAGNOSIS — E7800 Pure hypercholesterolemia, unspecified: Secondary | ICD-10-CM | POA: Diagnosis not present

## 2024-01-18 DIAGNOSIS — R7303 Prediabetes: Secondary | ICD-10-CM | POA: Diagnosis not present

## 2024-01-18 DIAGNOSIS — D649 Anemia, unspecified: Secondary | ICD-10-CM | POA: Diagnosis not present

## 2024-01-18 DIAGNOSIS — I10 Essential (primary) hypertension: Secondary | ICD-10-CM | POA: Diagnosis not present

## 2024-01-18 DIAGNOSIS — E559 Vitamin D deficiency, unspecified: Secondary | ICD-10-CM | POA: Diagnosis not present

## 2024-02-02 ENCOUNTER — Other Ambulatory Visit: Payer: Self-pay

## 2024-02-02 DIAGNOSIS — I480 Paroxysmal atrial fibrillation: Secondary | ICD-10-CM

## 2024-02-02 MED ORDER — FLECAINIDE ACETATE 50 MG PO TABS: 50.0000 mg | ORAL_TABLET | Freq: Two times a day (BID) | ORAL | 1 refills | Status: DC

## 2024-06-07 ENCOUNTER — Encounter (HOSPITAL_COMMUNITY): Payer: Medicare Other

## 2024-06-08 ENCOUNTER — Ambulatory Visit (HOSPITAL_COMMUNITY)
Admission: RE | Admit: 2024-06-08 | Discharge: 2024-06-08 | Disposition: A | Source: Ambulatory Visit | Attending: Cardiology | Admitting: Cardiology

## 2024-06-08 DIAGNOSIS — Z95828 Presence of other vascular implants and grafts: Secondary | ICD-10-CM | POA: Insufficient documentation

## 2024-06-08 DIAGNOSIS — I6523 Occlusion and stenosis of bilateral carotid arteries: Secondary | ICD-10-CM | POA: Insufficient documentation

## 2024-06-09 ENCOUNTER — Ambulatory Visit: Payer: Self-pay | Admitting: Cardiology

## 2024-06-09 ENCOUNTER — Other Ambulatory Visit: Payer: Self-pay | Admitting: Cardiology

## 2024-06-09 DIAGNOSIS — E7800 Pure hypercholesterolemia, unspecified: Secondary | ICD-10-CM

## 2024-06-09 NOTE — Progress Notes (Signed)
 Carotid artery duplex 06/08/2024: 1 to 39% stenosis of the right ICA.  <50% stenosis in the CCA. Widely patent left ICA stent without restenosis. Bilateral antegrade vertebral artery flow and mildly disturbed right subclavian artery flow, normal hemodynamics left subclavian artery.

## 2024-06-14 MED ORDER — EZETIMIBE 10 MG PO TABS: 10.0000 mg | ORAL_TABLET | Freq: Every day | ORAL | 0 refills | Status: DC

## 2024-06-17 ENCOUNTER — Ambulatory Visit: Attending: Cardiology | Admitting: Cardiology

## 2024-06-17 ENCOUNTER — Encounter: Payer: Self-pay | Admitting: Cardiology

## 2024-06-17 ENCOUNTER — Ambulatory Visit: Admitting: Cardiology

## 2024-06-17 VITALS — BP 125/72 | HR 67 | Resp 17 | Ht 72.0 in | Wt 183.0 lb

## 2024-06-17 DIAGNOSIS — I10 Essential (primary) hypertension: Secondary | ICD-10-CM

## 2024-06-17 DIAGNOSIS — Z95828 Presence of other vascular implants and grafts: Secondary | ICD-10-CM

## 2024-06-17 DIAGNOSIS — I480 Paroxysmal atrial fibrillation: Secondary | ICD-10-CM

## 2024-06-17 DIAGNOSIS — E7800 Pure hypercholesterolemia, unspecified: Secondary | ICD-10-CM

## 2024-06-17 MED ORDER — FLECAINIDE ACETATE 50 MG PO TABS: 50.0000 mg | ORAL_TABLET | Freq: Two times a day (BID) | ORAL | 3 refills | Status: AC

## 2024-06-17 NOTE — Patient Instructions (Addendum)
 Medication Instructions:  Your physician recommends that you continue on your current medications as directed. Please refer to the Current Medication list given to you today.  *If you need a refill on your cardiac medications before your next appointment, please call your pharmacy*  Follow-Up: At [facility], you and your health needs are our priority.  As part of our continuing mission to provide you with exceptional heart care, our providers are all part of one team.  This team includes your primary Cardiologist (physician) and Advanced Practice Providers or APPs (Physician Assistants and Nurse Practitioners) who all work together to provide you with the care you need, when you need it.  Your next appointment:   1 year(s)  Provider:   Knox Perl, MD   We recommend signing up for the patient portal called "MyChart".  Sign up information is provided on this After Visit Summary.  MyChart is used to connect with patients for Virtual Visits (Telemedicine).  Patients are able to view lab/test results, encounter notes, upcoming appointments, etc.  Non-urgent messages can be sent to your provider as well.   To learn more about what you can do with MyChart, go to [email or web address].

## 2024-06-17 NOTE — Progress Notes (Signed)
 Cardiology Office Note:  .   Date:  06/17/2024  ID:  Douglas Griffith, DOB 1946/05/08, MRN [id number] PCP: Corlis Pagan, NP  [facility] Providers Cardiologist:  Gordy Bergamo, MD   History of Present Illness: .   Douglas Griffith is a 78 y.o.  male  patient with  history of hypertension, hyperlipidemia, asymptomatic carotid stenosis and  paroxysmal atrial fibrillation and strong family history of premature coronary artery disease in father who had CAD in his 21s. underwent trans-carotid artery revascularization with flow reversal by Dr. Malvina New on 08/10/2019.   He presents here for 80-month office visit.  He has occasional episodes of breakthrough atrial fibrillation for which he uses extra dose of flecainide .  Otherwise continues to remain active and asymptomatic.    Discussed the use of AI scribe software for clinical note transcription with the patient, who gave verbal consent to proceed.  History of Present Illness Douglas Griffith is an 78 year old male with paroxysmal atrial fibrillation who presents for routine follow-up.  He has occasional nocturnal atrial fibrillation episodes that resolve within about 15 minutes when he takes an extra dose of flecainide . He takes flecainide  50 mg twice daily with an additional dose as needed, Xarelto  20 mg once daily, and diltiazem  120 mg once daily.  He takes rosuvastatin  20 mg and Zetia  10 mg daily for cholesterol without side effects. He monitors his blood pressure at home and has not noted changes.  He exercises regularly with walking and light weightlifting. He runs twice a week but has reduced this because of knee discomfort, not cardiopulmonary limitation.  He has a right carotid stent. His last echocardiogram was in 2024 and recent blood tests including cholesterol were reviewed.  He has no new symptoms or concerns today.  Cardiac Studies relevent.    Carotid artery duplex 06/08/2024: 1 to 39% stenosis of the right ICA.   <50% stenosis in the CCA. Widely patent left ICA stent without restenosis. Bilateral antegrade vertebral artery flow and mildly disturbed right subclavian artery flow, normal hemodynamics left subclavian artery.  Echocardiogram 07/15/2022: Normal LVEF, EF 69%.  Normal diastolic function.  Mild aortic valve calcification.  Labs   Care everywhere/Faxed External Labs:  Labs 01/18/2024:  Hb 13.6/HCT 43.4, platelets 294.  Serum glucose 92 mg, BUN 20, creatinine 1.02, eGFR 74 mL, potassium 4.2, LFTs normal.  Total cholesterol 150, triglycerides 69, HDL 63, LDL 73.  A1c 5.6%.  Vitamin D48.8.  ROS  Review of Systems  Cardiovascular:  Positive for palpitations (occasional). Negative for chest pain, dyspnea on exertion and leg swelling.   Physical Exam:   VS:  BP 125/72 (BP Location: Left Arm, Patient Position: Sitting, Cuff Size: Normal)   Pulse 67   Resp 17   Ht 6' (1.829 m)   Wt 183 lb (83 kg)   SpO2 97%   BMI 24.82 kg/m    Wt Readings from Last 3 Encounters:  06/17/24 183 lb (83 kg)  06/23/23 187 lb 3.2 oz (84.9 kg)  01/12/23 184 lb 9.6 oz (83.7 kg)    BP Readings from Last 3 Encounters:  06/17/24 125/72  06/23/23 134/64  01/12/23 100/61   Physical Exam Neck:     Vascular: Carotid bruit (right carotid bruit) present. No JVD.  Cardiovascular:     Rate and Rhythm: Normal rate and regular rhythm.     Pulses: Intact distal pulses.     Heart sounds: Normal heart sounds. No murmur heard.    No  gallop.  Pulmonary:     Effort: Pulmonary effort is normal.     Breath sounds: Normal breath sounds.  Abdominal:     General: Bowel sounds are normal.     Palpations: Abdomen is soft.  Musculoskeletal:     Right lower leg: No edema.     Left lower leg: No edema.    EKG:         ASSESSMENT AND PLAN: .      ICD-10-CM   1. Paroxysmal atrial fibrillation (HCC)  I48.0 EKG 12-Lead    flecainide  (TAMBOCOR ) 50 MG tablet    2. Presence of internal left carotid stent: TCAR  08/10/2019  Z95.828     3. Essential hypertension  I10     4. Hypercholesteremia  E78.00      Assessment & Plan Paroxysmal atrial fibrillation Managed with flecainide  and Xarelto . Occasional breakthrough episodes managed with additional flecainide . No significant side effects from Xarelto . High risk for stroke due to age, hypertension, and vascular disease. - Continue flecainide  50 mg BID and one additional dose as needed for breakthrough episodes. - Continue Xarelto  20 mg once daily. - Monitor for signs of bleeding, such as changes in stool color or unusual stool odor, and report immediately if observed.  Carotid artery disease with stent Carotid artery disease with stent placement. Right carotid stenosis reduced from 50-60% to 5-15% with minimal plaque with aggressive lipid management. Left carotid stent patent with no bruit. Stable calcified plaque with no soft plaque concerns. - Continue current management with statins and Zetia . - No follow-up carotid duplex planned  Essential hypertension Well controlled with current medication regimen. Blood pressure reading is 125/72 mmHg. - Continue current antihypertensive regimen.  Hypercholesterolemia Managed with rosuvastatin  and Zetia . LDL is 73 mg/dL, HDL is 73 mg/dL.  - Continue rosuvastatin  20 mg daily and Zetia  10 mg daily. - Reassured that missing a dose occasionally is not a concern.  Follow up: 1 Year. PAF, HTN, Carotid stenosis   Signed,  Gordy Bergamo, MD, [facility] 06/17/2024, 5:33 PM [facility] [address] [place], [place] [address] Phone: [phone]. Fax:  [phone]

## 2024-06-20 DIAGNOSIS — D649 Anemia, unspecified: Secondary | ICD-10-CM | POA: Diagnosis not present

## 2024-06-20 DIAGNOSIS — I10 Essential (primary) hypertension: Secondary | ICD-10-CM | POA: Diagnosis not present

## 2024-06-20 DIAGNOSIS — E7800 Pure hypercholesterolemia, unspecified: Secondary | ICD-10-CM | POA: Diagnosis not present

## 2024-06-20 DIAGNOSIS — E559 Vitamin D deficiency, unspecified: Secondary | ICD-10-CM | POA: Diagnosis not present

## 2024-06-20 DIAGNOSIS — Z0000 Encounter for general adult medical examination without abnormal findings: Secondary | ICD-10-CM | POA: Diagnosis not present

## 2024-06-20 DIAGNOSIS — R7303 Prediabetes: Secondary | ICD-10-CM | POA: Diagnosis not present

## 2024-06-20 LAB — LAB REPORT - SCANNED
A1c: 5.6
EGFR: 74

## 2024-06-23 ENCOUNTER — Other Ambulatory Visit: Payer: Self-pay

## 2024-06-23 DIAGNOSIS — E7800 Pure hypercholesterolemia, unspecified: Secondary | ICD-10-CM

## 2024-06-23 MED ORDER — EZETIMIBE 10 MG PO TABS: 10.0000 mg | ORAL_TABLET | Freq: Every day | ORAL | 3 refills | Status: AC
